# Patient Record
Sex: Female | Born: 1965 | Race: White | Hispanic: No | Marital: Married | Smoking: Former smoker
Health system: Southern US, Community
[De-identification: ages and names within clinical notes are randomized; demographics above are authoritative.]

## PROBLEM LIST (undated history)

## (undated) DIAGNOSIS — D250 Submucous leiomyoma of uterus: Secondary | ICD-10-CM

## (undated) DIAGNOSIS — R350 Frequency of micturition: Secondary | ICD-10-CM

## (undated) DIAGNOSIS — N950 Postmenopausal bleeding: Secondary | ICD-10-CM

## (undated) DIAGNOSIS — Z973 Presence of spectacles and contact lenses: Secondary | ICD-10-CM

## (undated) DIAGNOSIS — E039 Hypothyroidism, unspecified: Secondary | ICD-10-CM

## (undated) DIAGNOSIS — Z862 Personal history of diseases of the blood and blood-forming organs and certain disorders involving the immune mechanism: Secondary | ICD-10-CM

## (undated) DIAGNOSIS — F909 Attention-deficit hyperactivity disorder, unspecified type: Secondary | ICD-10-CM

---

## 2015-02-22 HISTORY — PX: BREAST BIOPSY: SHX20

## 2018-05-13 ENCOUNTER — Other Ambulatory Visit: Payer: Self-pay | Admitting: Internal Medicine

## 2018-05-13 DIAGNOSIS — E041 Nontoxic single thyroid nodule: Secondary | ICD-10-CM

## 2018-05-13 DIAGNOSIS — Z1231 Encounter for screening mammogram for malignant neoplasm of breast: Secondary | ICD-10-CM

## 2018-05-18 ENCOUNTER — Ambulatory Visit: Payer: Self-pay

## 2018-05-18 ENCOUNTER — Ambulatory Visit: Payer: Commercial Managed Care - PPO

## 2018-06-11 ENCOUNTER — Ambulatory Visit
Admission: RE | Admit: 2018-06-11 | Discharge: 2018-06-11 | Disposition: A | Discharge status: Home / Self Care | Payer: Commercial Managed Care - PPO | Source: Ambulatory Visit | Attending: Internal Medicine | Admitting: Internal Medicine

## 2018-06-11 ENCOUNTER — Encounter: Payer: Self-pay | Admitting: Radiology

## 2018-06-11 DIAGNOSIS — Z1231 Encounter for screening mammogram for malignant neoplasm of breast: Secondary | ICD-10-CM

## 2018-07-02 ENCOUNTER — Ambulatory Visit: Payer: Commercial Managed Care - PPO

## 2018-07-05 ENCOUNTER — Other Ambulatory Visit: Payer: Self-pay | Admitting: Internal Medicine

## 2018-07-05 DIAGNOSIS — N6310 Unspecified lump in the right breast, unspecified quadrant: Secondary | ICD-10-CM

## 2018-07-05 DIAGNOSIS — R928 Other abnormal and inconclusive findings on diagnostic imaging of breast: Secondary | ICD-10-CM

## 2018-07-05 DIAGNOSIS — N6320 Unspecified lump in the left breast, unspecified quadrant: Secondary | ICD-10-CM

## 2018-07-07 ENCOUNTER — Ambulatory Visit
Admission: RE | Admit: 2018-07-07 | Discharge: 2018-07-07 | Disposition: A | Discharge status: Home / Self Care | Payer: Commercial Managed Care - PPO | Source: Ambulatory Visit | Attending: Internal Medicine | Admitting: Internal Medicine

## 2018-07-07 DIAGNOSIS — N6310 Unspecified lump in the right breast, unspecified quadrant: Secondary | ICD-10-CM | POA: Diagnosis present

## 2018-07-07 DIAGNOSIS — R928 Other abnormal and inconclusive findings on diagnostic imaging of breast: Secondary | ICD-10-CM | POA: Insufficient documentation

## 2018-07-07 DIAGNOSIS — N6320 Unspecified lump in the left breast, unspecified quadrant: Secondary | ICD-10-CM

## 2018-07-28 ENCOUNTER — Ambulatory Visit: Payer: Commercial Managed Care - PPO | Admitting: Obstetrics & Gynecology

## 2018-07-28 ENCOUNTER — Encounter: Payer: Self-pay | Admitting: Obstetrics & Gynecology

## 2018-07-28 VITALS — BP 120/82 | Ht 60.0 in | Wt 150.0 lb

## 2018-07-28 DIAGNOSIS — N921 Excessive and frequent menstruation with irregular cycle: Secondary | ICD-10-CM

## 2018-07-28 DIAGNOSIS — D219 Benign neoplasm of connective and other soft tissue, unspecified: Secondary | ICD-10-CM | POA: Diagnosis not present

## 2018-07-28 MED ORDER — PROGESTERONE MICRONIZED 100 MG PO CAPS: 100.0000 mg | ORAL_CAPSULE | Freq: Every day | ORAL | 4 refills | Status: DC

## 2018-07-28 NOTE — Progress Notes (Signed)
    Samantha Schultz [birth year] [id number]        53 y.o. G0 Married  RP:  Menometrorrhagia with vaginal bleeding x 06/24/2018  HPI:  Seen by me at [place] 02/2016.  [place] 36 at that time.  Since then though, has had menses every 2-3 months with rather heavy flow.  Bleeding mildly vaginally since 06/24/2018.  No pelvic pain.  No pain with IC.  No hot flushes/night sweats. H/O uterine Fibroids.   OB History  Gravida Para Term Preterm AB Living  0 0 0 0 0 0  SAB TAB Ectopic Multiple Live Births  0 0 0 0 0    Past medical history,surgical history, problem list, medications, allergies, family history and social history were all reviewed and documented in the EPIC chart.   Directed ROS with pertinent positives and negatives documented in the history of present illness/assessment and plan.  Exam:  Vitals:   07/28/18 1509  BP: 120/82  Weight: 150 lb (68 kg)  Height: 5' (1.524 m)   General appearance:  Normal  Abdomen: Normal  Gynecologic exam: Vulva normal.  Bimanual exam:  Uterus AV, mildly increase in size, mobile, NT.  No adnexal mass, NT.   Assessment/Plan:  53 y.o. G0P0000   1. Menometrorrhagia Oligomenorrhea and menometrorrhagia probably in perimenopause.  History of fibroids with a mildly increased size of the uterus on exam today.  Will rule out anemia with a CBC, verify thyroid function and prolactin.  Will check menopausal status with an Norco.  Decision to start on Prometrium 100 mg daily now and will reevaluate per lab results and pelvic ultrasound results at follow-up.  Usage, risks and benefits of Prometrium reviewed with patient and prescription sent to pharmacy.  Pelvic ultrasound to rule out intra-uterine lesion such as polyp, fibroid, endometrial hyperplasia and endometrial cancer.  Will also evaluate for uterine fibroids, location and size. - US Transvaginal Non-OB; Future - CBC - TSH - Prolactin - FSH  2. Fibroids As above, follow-up pelvic ultrasound to  evaluate location and size of fibroids, as well as to rule out endometrial pathology. - US Transvaginal Non-OB; Future  Other orders - escitalopram (LEXAPRO) 20 MG tablet; Take 20 mg by mouth daily. - amphetamine-dextroamphetamine (ADDERALL) 30 MG tablet; Take 30 mg by mouth 2 (two) times daily. - progesterone (PROMETRIUM) 100 MG capsule; Take 1 capsule (100 mg total) by mouth daily.  Counseling on above issues and coordination of care more than 50% for 40 minutes.  Princess Bruins MD, 3:23 PM 07/28/2018

## 2018-07-28 NOTE — Patient Instructions (Signed)
1. Menometrorrhagia Oligomenorrhea and menometrorrhagia probably in perimenopause.  History of fibroids with a mildly increased size of the uterus on exam today.  Will rule out anemia with a CBC, verify thyroid function and prolactin.  Will check menopausal status with an [employer].  Decision to start on Prometrium 100 mg daily now and will reevaluate per lab results and pelvic ultrasound results at follow-up.  Usage, risks and benefits of Prometrium reviewed with patient and prescription sent to pharmacy.  Pelvic ultrasound to rule out intra-uterine lesion such as polyp, fibroid, endometrial hyperplasia and endometrial cancer.  Will also evaluate for uterine fibroids, location and size. - US Transvaginal Non-OB; Future - CBC - TSH - Prolactin - FSH  2. Fibroids As above, follow-up pelvic ultrasound to evaluate location and size of fibroids, as well as to rule out endometrial pathology. - US Transvaginal Non-OB; Future  Other orders - escitalopram (LEXAPRO) 20 MG tablet; Take 20 mg by mouth daily. - amphetamine-dextroamphetamine (ADDERALL) 30 MG tablet; Take 30 mg by mouth 2 (two) times daily. - progesterone (PROMETRIUM) 100 MG capsule; Take 1 capsule (100 mg total) by mouth daily.  Samantha Schultz, it was a pleasure seeing you today!  I will inform you of your results as soon as they are available.

## 2018-07-29 LAB — CBC
HCT: 38 % (ref 35.0–45.0)
Hemoglobin: 12.9 g/dL (ref 11.7–15.5)
MCH: 31.3 pg (ref 27.0–33.0)
MCHC: 33.9 g/dL (ref 32.0–36.0)
MCV: 92.2 fL (ref 80.0–100.0)
MPV: 10.5 fL (ref 7.5–12.5)
Platelets: 360 10*3/uL (ref 140–400)
RBC: 4.12 10*6/uL (ref 3.80–5.10)
RDW: 11.7 % (ref 11.0–15.0)
WBC: 8.9 10*3/uL (ref 3.8–10.8)

## 2018-07-29 LAB — TSH: TSH: 2.83 mIU/L

## 2018-07-29 LAB — FOLLICLE STIMULATING HORMONE: FSH: 80.6 m[IU]/mL

## 2018-07-29 LAB — PROLACTIN: Prolactin: 5.9 ng/mL

## 2018-08-09 ENCOUNTER — Telehealth: Payer: Self-pay | Admitting: *Deleted

## 2018-08-09 NOTE — Telephone Encounter (Signed)
Patient called c/o spotting had visit on 07/28/18 told to schedule ultrasound, scheduled for this on 09/01/18. She is concerned about the spotting, was prescribed Prometrium 100 mg states to help with bleeding? Wanted to know if she could go to outpatient center to have ultrasound done sooner?

## 2018-08-11 MED ORDER — PROGESTERONE MICRONIZED 200 MG PO CAPS: 200.0000 mg | ORAL_CAPSULE | Freq: Every day | ORAL | 0 refills | Status: DC

## 2018-08-11 NOTE — Telephone Encounter (Signed)
Pelvic US 09/01/18 is coming very soon.  Can increase Prometrium to 200 mg HS until then, which may help the spotting.

## 2018-08-11 NOTE — Telephone Encounter (Signed)
Left detailed message on cell per DPR access. 

## 2018-08-11 NOTE — Telephone Encounter (Signed)
Patient called back requesting 200 mg dose sent to pharmacy

## 2018-08-18 ENCOUNTER — Encounter: Payer: Self-pay | Admitting: Obstetrics & Gynecology

## 2018-08-18 ENCOUNTER — Ambulatory Visit: Payer: Commercial Managed Care - PPO | Admitting: Obstetrics & Gynecology

## 2018-08-18 ENCOUNTER — Ambulatory Visit (INDEPENDENT_AMBULATORY_CARE_PROVIDER_SITE_OTHER): Payer: Commercial Managed Care - PPO

## 2018-08-18 VITALS — BP 108/82 | Wt 141.0 lb

## 2018-08-18 DIAGNOSIS — D219 Benign neoplasm of connective and other soft tissue, unspecified: Secondary | ICD-10-CM

## 2018-08-18 DIAGNOSIS — N921 Excessive and frequent menstruation with irregular cycle: Secondary | ICD-10-CM

## 2018-08-18 DIAGNOSIS — N950 Postmenopausal bleeding: Secondary | ICD-10-CM | POA: Diagnosis not present

## 2018-08-18 DIAGNOSIS — R350 Frequency of micturition: Secondary | ICD-10-CM | POA: Diagnosis not present

## 2018-08-18 NOTE — Progress Notes (Signed)
    Samantha Schultz [id number] [birth year] [id number]        53 y.o.  G0P0000 Married  RP: PMB/Fibroids for Pelvic US  HPI: PMB resolved.  Feels some suprapubic pressure and c/O urinary frequency.  No blood in urine.  No flank pain.  No fever.   OB History  Gravida Para Term Preterm AB Living  0 0 0 0 0 0  SAB TAB Ectopic Multiple Live Births  0 0 0 0 0    Past medical history,surgical history, problem list, medications, allergies, family history and social history were all reviewed and documented in the EPIC chart.   Directed ROS with pertinent positives and negatives documented in the history of present illness/assessment and plan.  Exam:  Vitals:   08/18/18 1111  BP: 108/82  Weight: 141 lb (64 kg)   General appearance:  Normal  Pelvic US today: T/V and T/a images.  Anteverted uterus measuring 9.38 x 7.38 x 5.82 cm.  Endometrial lining measured at 5.5 mm.  Intramural fibroids measuring 4.2 x 3.4 x 4.0 cm.  Fibroid probably partially submucosal posteriorly measuring 3.7 x 2.6 cm.  2 smaller fibroids on the right measuring 2.4 x 2.6 cm and 2.0 x 1.9 cm.  Cervix with nabothian cysts.  Right and left ovaries normal.  No apparent mass in the right or left adnexa.  No free fluid in the posterior cul-de-sac.  U/A completely negative   Assessment/Plan:  53 y.o. G0P0000   1. Postmenopausal bleeding Postmenopausal bleeding at 24, not on hormone replacement therapy.  Today's pelvic ultrasound findings reviewed with patient.  The pelvic ultrasound showis an endometrial lining at 5.5 mm.  1 of the intramural fibroids might have a submucosal component posteriorly, the fibroid measures 3.7 x 2.6 cm.  Patient had 4 uterine fibroids measured, the largest of which was 4.2 cm.  Both ovaries are normal and there was no free fluid in the posterior cul-de-sac.  Given the possible submucosal fibroid and the endometrial lining at 5.5 mm, recommendation made to proceed with further investigation with a  sonohysterogram with possible endometrial biopsy.  Procedure described to patient who will schedule the sonohysterogram today. - [place] Sonohysterogram; Future  2. Urinary frequency Urine analysis completely negative, no indication to proceed with a urine culture.  - Urinalysis,Complete w/RFL Culture  Counseling on above issues and coordination of care more than 50% for 15 minutes.  Princess Bruins MD, 11:35 AM 08/18/2018

## 2018-08-18 NOTE — Patient Instructions (Signed)
1. Postmenopausal bleeding Postmenopausal bleeding at 62, not on hormone replacement therapy.  Today's pelvic ultrasound findings reviewed with patient.  The pelvic ultrasound showis an endometrial lining at 5.5 mm.  1 of the intramural fibroids might have a submucosal component posteriorly, the fibroid measures 3.7 x 2.6 cm.  Patient had 4 uterine fibroids measured, the largest of which was 4.2 cm.  Both ovaries are normal and there was no free fluid in the posterior cul-de-sac.  Given the possible submucosal fibroid and the endometrial lining at 5.5 mm, recommendation made to proceed with further investigation with a sonohysterogram with possible endometrial biopsy.  Procedure described to patient who will schedule the sonohysterogram today. - [place] Sonohysterogram; Future  2. Urinary frequency Urine analysis completely negative, no indication to proceed with a urine culture.  - Urinalysis,Complete w/RFL Culture  Samantha Schultz, it was a pleasure seeing you today!

## 2018-08-19 LAB — URINALYSIS, COMPLETE W/RFL CULTURE
BACTERIA UA: NONE SEEN /HPF
Bilirubin Urine: NEGATIVE
Glucose, UA: NEGATIVE
Hgb urine dipstick: NEGATIVE
Hyaline Cast: NONE SEEN /LPF
Ketones, ur: NEGATIVE
Leukocyte Esterase: NEGATIVE
Nitrites, Initial: NEGATIVE
Protein, ur: NEGATIVE
RBC / HPF: NONE SEEN /HPF (ref 0–2)
Specific Gravity, Urine: 1.01 (ref 1.001–1.03)
WBC, UA: NONE SEEN /HPF (ref 0–5)
pH: 7 (ref 5.0–8.0)

## 2018-08-19 LAB — NO CULTURE INDICATED

## 2018-09-01 ENCOUNTER — Ambulatory Visit: Payer: Commercial Managed Care - PPO | Admitting: Obstetrics & Gynecology

## 2018-09-01 ENCOUNTER — Other Ambulatory Visit: Payer: Commercial Managed Care - PPO

## 2018-09-07 ENCOUNTER — Other Ambulatory Visit: Payer: Self-pay

## 2018-09-07 ENCOUNTER — Ambulatory Visit (INDEPENDENT_AMBULATORY_CARE_PROVIDER_SITE_OTHER): Payer: Commercial Managed Care - PPO

## 2018-09-07 ENCOUNTER — Ambulatory Visit: Payer: Commercial Managed Care - PPO | Admitting: Obstetrics & Gynecology

## 2018-09-07 DIAGNOSIS — R9389 Abnormal findings on diagnostic imaging of other specified body structures: Secondary | ICD-10-CM

## 2018-09-07 DIAGNOSIS — D219 Benign neoplasm of connective and other soft tissue, unspecified: Secondary | ICD-10-CM

## 2018-09-07 DIAGNOSIS — N950 Postmenopausal bleeding: Secondary | ICD-10-CM | POA: Diagnosis not present

## 2018-09-07 NOTE — Progress Notes (Signed)
    Samantha Schultz [birth year] [id number]        53 y.o.  G0  RP: PMB/Probable Fibroid with SM component for Sonohysterogram  HPI: PMB resolved.  No pelvic pain.   OB History  Gravida Para Term Preterm AB Living  0 0 0 0 0 0  SAB TAB Ectopic Multiple Live Births  0 0 0 0 0    Past medical history,surgical history, problem list, medications, allergies, family history and social history were all reviewed and documented in the EPIC chart.   Directed ROS with pertinent positives and negatives documented in the history of present illness/assessment and plan.  Exam:  There were no vitals filed for this visit. General appearance:  Normal                                                                    Sono Infusion Hysterogram ( procedure note)   The initial transvaginal ultrasound demonstrated the following: Uterus with fibroids, including a posterior fibroid encroaching the cavity.  The endometrial lining is measured at 6.2 mm.  The right and left ovaries are normal.  No free fluid in the posterior to the sac.  The speculum  was inserted and the cervix cleansed with Betadine solution after confirming that patient has no allergies.A small sonohysterography catheterwas utilized.  Insertion was facilitated with ring forceps, using a spear-like motion the catheter was inserted to the fundus of the uterus. The speculum is then removed carefully to avoid dislodging the catheter. The catheter was flushed with sterile saline delete prior to insertion to rid it of small amounts of air.the sterile saline solution was infused into the uterine cavity as a vaginal ultrasound probe was then placed in the vagina for full visualization of the uterine cavity from a transvaginal approach. The following was noted: Following injection of saline the endometrial cavity is filled with no defect seen.  The catheter was then removed after retrieving some of the saline from the intrauterine cavity. An  endometrial biopsy was done. Patient tolerated procedure well. She had received a tablet of Aleve for discomfort.    Assessment/Plan:  53 y.o. G0  1. Postmenopausal bleeding Resolved postmenopausal bleeding.  Endometrial lining at 6.2 mm, no intra-uterine defect.  Endometrial biopsy done, results pending.  Will continue on Prometrium daily to control the endometrium.  2. Thickened endometrium EBx done, pending results.  Continue on Prometrium 100 mg HS daily.  3. Fibroid Intramural fibroids.  Counseling on above issues and coordination of care more than 50% for 15 minutes.  Princess Bruins MD, 4:26 PM 09/07/2018

## 2018-09-09 ENCOUNTER — Other Ambulatory Visit: Payer: Self-pay | Admitting: Obstetrics & Gynecology

## 2018-09-13 ENCOUNTER — Encounter: Payer: Self-pay | Admitting: Obstetrics & Gynecology

## 2018-09-13 NOTE — Patient Instructions (Signed)
1. Postmenopausal bleeding Resolved postmenopausal bleeding.  Endometrial lining at 6.2 mm, no intra-uterine defect.  Endometrial biopsy done, results pending.  Will continue on Prometrium daily to control the endometrium.  2. Thickened endometrium EBx done, pending results.  Continue on Prometrium 100 mg HS daily.  3. Fibroid Intramural fibroids.  Azalia, it was a pleasure seeing you today!  I will inform you of your results as soon as they are available.

## 2018-12-09 ENCOUNTER — Encounter: Payer: Commercial Managed Care - PPO | Admitting: Obstetrics & Gynecology

## 2018-12-30 ENCOUNTER — Other Ambulatory Visit: Payer: Self-pay | Admitting: Obstetrics & Gynecology

## 2019-01-12 ENCOUNTER — Encounter: Payer: Self-pay | Admitting: Obstetrics & Gynecology

## 2019-01-12 ENCOUNTER — Ambulatory Visit (INDEPENDENT_AMBULATORY_CARE_PROVIDER_SITE_OTHER): Payer: Commercial Managed Care - PPO | Admitting: Obstetrics & Gynecology

## 2019-01-12 ENCOUNTER — Other Ambulatory Visit: Payer: Self-pay

## 2019-01-12 VITALS — BP 126/78 | Ht 59.0 in | Wt 145.0 lb

## 2019-01-12 DIAGNOSIS — Z01419 Encounter for gynecological examination (general) (routine) without abnormal findings: Secondary | ICD-10-CM

## 2019-01-12 DIAGNOSIS — E663 Overweight: Secondary | ICD-10-CM | POA: Diagnosis not present

## 2019-01-12 DIAGNOSIS — Z780 Asymptomatic menopausal state: Secondary | ICD-10-CM | POA: Diagnosis not present

## 2019-01-12 MED ORDER — PROGESTERONE MICRONIZED 100 MG PO CAPS: 100.0000 mg | ORAL_CAPSULE | Freq: Every day | ORAL | 4 refills | Status: DC

## 2019-01-12 NOTE — Progress Notes (Signed)
Samantha Schultz [id number] 07/26/1965 [id number]   History:    53 y.o. G0  Married  RP:  Established patient presenting for annual gyn exam   HPI: Had perimenopausal bleeding 06/2018 to 07/2018.  Pelvic US/SonoHysto showing Fibroids with a 6.2 mm endometrial lining and a normal endometrial cavity.  EBx benign.  [place] 80.6 in 07/2018.  Started on Prometrium at that time, no vaginal bleeding since then.  No pelvic pain.  No pain with IC.  Urine normal.  Managing tendency towards constipation.  Breasts normal.  BMI 29.29.  Walking regularly.  Health labs with Fam MD.  Past medical history,surgical history, family history and social history were all reviewed and documented in the EPIC chart.  Gynecologic History Patient's last menstrual period was 06/23/2018. Contraception: post menopausal status Last Pap: 2017. Results were: normal per patient Last mammogram: 06/2018. Results were: Benign Bone Density: Never Colonoscopy: Never.  Cologard  Obstetric History OB History  Gravida Para Term Preterm AB Living  0 0 0 0 0 0  SAB TAB Ectopic Multiple Live Births  0 0 0 0 0     ROS: A ROS was performed and pertinent positives and negatives are included in the history.  GENERAL: No fevers or chills. HEENT: No change in vision, no earache, sore throat or sinus congestion. NECK: No pain or stiffness. CARDIOVASCULAR: No chest pain or pressure. No palpitations. PULMONARY: No shortness of breath, cough or wheeze. GASTROINTESTINAL: No abdominal pain, nausea, vomiting or diarrhea, melena or bright red blood per rectum. GENITOURINARY: No urinary frequency, urgency, hesitancy or dysuria. MUSCULOSKELETAL: No joint or muscle pain, no back pain, no recent trauma. DERMATOLOGIC: No rash, no itching, no lesions. ENDOCRINE: No polyuria, polydipsia, no heat or cold intolerance. No recent change in weight. HEMATOLOGICAL: No anemia or easy bruising or bleeding. NEUROLOGIC: No headache, seizures, numbness, tingling or weakness.  PSYCHIATRIC: No depression, no loss of interest in normal activity or change in sleep pattern.     Exam:   BP 126/78   Ht 4\' 11"  (1.499 m)   Wt 145 lb (65.8 kg)   LMP 06/23/2018   BMI 29.29 kg/m   Body mass index is 29.29 kg/m.  General appearance : Well developed well nourished female. No acute distress HEENT: Eyes: no retinal hemorrhage or exudates,  Neck supple, trachea midline, no carotid bruits, no thyroidmegaly Lungs: Clear to auscultation, no rhonchi or wheezes, or rib retractions  Heart: Regular rate and rhythm, no murmurs or gallops Breast:Examined in sitting and supine position were symmetrical in appearance, no palpable masses or tenderness,  no skin retraction, no nipple inversion, no nipple discharge, no skin discoloration, no axillary or supraclavicular lymphadenopathy Abdomen: no palpable masses or tenderness, no rebound or guarding Extremities: no edema or skin discoloration or tenderness  Pelvic: Vulva: Normal             Vagina: No gross lesions or discharge  Cervix: No gross lesions or discharge.  Pap reflex done.  Uterus  AV, normal size, shape and consistency, non-tender and mobile  Adnexa  Without masses or tenderness  Anus: Normal   Assessment/Plan:  54 y.o. female for annual exam   1. Encounter for routine gynecological examination with Papanicolaou smear of cervix Normal gynecologic exam in menopause.  Pap reflex done.  Breast exam normal.  Screening mammogram January 2020 was benign.  Cologuard in 2019.  Recommend doing a colonoscopy in 2021.  Health labs with family physician.  2. Postmenopause Postmenopausal with no further postmenopausal  bleeding since started on Prometrium in March 2020.  Sonohysterogram showed a normal endometrial cavity and endometrial biopsy March 2020 was benign.  Will continue on Prometrium 100 mg at bedtime every night.  Prescription sent to pharmacy.  Recommend vitamin D supplements, calcium intake of 1200 mg daily and  regular weightbearing physical activities.  3. Overweight (BMI 25.0-29.9) Recommend decreasing calories and carbs.  Continue with fitness activities.  Other orders - busPIRone (BUSPAR) 5 MG tablet; Take 5 mg by mouth 3 (three) times daily. - progesterone (PROMETRIUM) 100 MG capsule; Take 1 capsule (100 mg total) by mouth at bedtime.  Princess Bruins MD, 3:26 PM 01/12/2019

## 2019-01-12 NOTE — Patient Instructions (Signed)
1. Encounter for routine gynecological examination with Papanicolaou smear of cervix Normal gynecologic exam in menopause.  Pap reflex done.  Breast exam normal.  Screening mammogram January 2020 was benign.  Cologuard in 2019.  Recommend doing a colonoscopy in 2021.  Health labs with family physician.  2. Postmenopause Postmenopausal with no further postmenopausal bleeding since started on Prometrium in March 2020.  Sonohysterogram showed a normal endometrial cavity and endometrial biopsy March 2020 was benign.  Will continue on Prometrium 100 mg at bedtime every night.  Prescription sent to pharmacy.  Recommend vitamin D supplements, calcium intake of 1200 mg daily and regular weightbearing physical activities.  3. Overweight (BMI 25.0-29.9) Recommend decreasing calories and carbs.  Continue with fitness activities.  Other orders - busPIRone (BUSPAR) 5 MG tablet; Take 5 mg by mouth 3 (three) times daily. - progesterone (PROMETRIUM) 100 MG capsule; Take 1 capsule (100 mg total) by mouth at bedtime.  Samantha Schultz, it was a pleasure seeing you today!  I will inform you of your results as soon as they are available.

## 2019-01-12 NOTE — Addendum Note (Signed)
Addended by: Thurnell Garbe A on: 01/12/2019 03:56 PM   Modules accepted: Orders

## 2019-01-13 LAB — PAP IG W/ RFLX HPV ASCU

## 2019-03-02 NOTE — Telephone Encounter (Signed)
Not unusual to have some irregular bleeding in the perimenopause.  She is on hormone replacement and on her dosage she should not be bleeding at all.  She was evaluated in March to include ultrasound and negative biopsy which makes it highly unlikely that there is significant pathology going on.  At this point I would continue on her medication and leave the message for Dr Dellis Filbert to see if she wants to do anything with this.

## 2019-03-08 ENCOUNTER — Other Ambulatory Visit: Payer: Self-pay

## 2019-03-08 DIAGNOSIS — N950 Postmenopausal bleeding: Secondary | ICD-10-CM

## 2019-03-15 ENCOUNTER — Other Ambulatory Visit: Payer: Self-pay

## 2019-03-15 MED ORDER — MEGESTROL ACETATE 20 MG PO TABS: ORAL_TABLET | ORAL | 0 refills | Status: DC

## 2019-04-02 ENCOUNTER — Other Ambulatory Visit: Payer: Self-pay | Admitting: Obstetrics & Gynecology

## 2019-04-04 ENCOUNTER — Other Ambulatory Visit: Payer: Self-pay

## 2019-04-04 NOTE — Addendum Note (Signed)
Addended by: Ramond Craver on: 04/04/2019 10:08 AM   Modules accepted: Orders

## 2019-04-06 ENCOUNTER — Other Ambulatory Visit: Payer: Self-pay

## 2019-04-07 ENCOUNTER — Ambulatory Visit (INDEPENDENT_AMBULATORY_CARE_PROVIDER_SITE_OTHER): Payer: Commercial Managed Care - PPO

## 2019-04-07 ENCOUNTER — Encounter: Payer: Self-pay | Admitting: Obstetrics & Gynecology

## 2019-04-07 ENCOUNTER — Ambulatory Visit: Payer: Commercial Managed Care - PPO | Admitting: Obstetrics & Gynecology

## 2019-04-07 VITALS — BP 126/80

## 2019-04-07 DIAGNOSIS — D250 Submucous leiomyoma of uterus: Secondary | ICD-10-CM

## 2019-04-07 DIAGNOSIS — N950 Postmenopausal bleeding: Secondary | ICD-10-CM

## 2019-04-07 MED ORDER — PROGESTERONE MICRONIZED 100 MG PO CAPS: 100.0000 mg | ORAL_CAPSULE | Freq: Every day | ORAL | 4 refills | Status: DC

## 2019-04-07 NOTE — Progress Notes (Signed)
    Samantha Schultz [id number] 08-13-1965 [id number]        53 y.o.  G0P0000   RP: PMB for Pelvic US  HPI: Continued postmenopausal bleeding on Prometrium and at times on Megace.  Menopause was confirmed via high [place] in February 2020.  Endometrial biopsy March 2020 was benign.   OB History  Gravida Para Term Preterm AB Living  0 0 0 0 0 0  SAB TAB Ectopic Multiple Live Births  0 0 0 0 0    Past medical history,surgical history, problem list, medications, allergies, family history and social history were all reviewed and documented in the EPIC chart.   Directed ROS with pertinent positives and negatives documented in the history of present illness/assessment and plan.  Exam:  Vitals:   04/07/19 1115  BP: 126/80   General appearance:  Normal  Pelvic US today: T/V images.  Pelvic ultrasound compared to previous scan February 2020.  Anteverted uterus enlarged measuring 10.44 x 8.86 x 7.18 cm.  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  The submucosal fibroid is in the left horn of the cavity, measuring 1.43 x 0.86 cm.  Endometrial lining is measured at 9.18 mm.  Both ovaries are small with sparse subcentimeter follicles.  No adnexal mass.  No free fluid in the posterior cul-de-sac.   Assessment/Plan:  53 y.o. G0P0000   1. Postmenopausal bleeding Submucosal fibroid in the left horn of the uterus measuring 1.43 x 0.86 cm.  The endometrial lining is measured at 9.18 mm.  The pelvic ultrasound reveals also intramural fibroids and probable adenomyosis.  The ovaries are normal.  2. Fibroids, submucosal Given an intrauterine lesion compatible with a submucosal fibroid and a thickened endometrial lining, decision to proceed with hysteroscopy with excision of the lesion and D&C.  Schedule HSC Myosure excision of SM Myoma, D+C.  Surgical procedure explained.  Preop preparation,  surgical risks and benefits, as well as postop precautions reviewed thoroughly with patient.  Patient voiced understanding and agreement with plan.  [place] pamphlet given.  Other orders - megestrol (MEGACE) 20 MG tablet; Take 20 mg by mouth daily. - progesterone (PROMETRIUM) 100 MG capsule; Take 1 capsule (100 mg total) by mouth at bedtime.                        Patient was counseled as to the risk of surgery to include the following:  1. Infection (prohylactic antibiotics will be administered)  2. DVT/Pulmonary Embolism (prophylactic pneumo compression stockings will be used)  3.Trauma to internal organs requiring additional surgical procedure to repair any injury to internal organs requiring perhaps additional hospitalization days.  4.Hemmorhage requiring transfusion and blood products which carry risks such as anaphylactic reaction, hepatitis and AIDS  Patient had received literature information on the procedure scheduled and all her questions were answered and fully accepts all risks.   Princess Bruins MD, 11:47 AM 04/07/2019

## 2019-04-11 ENCOUNTER — Encounter: Payer: Self-pay | Admitting: Anesthesiology

## 2019-04-13 NOTE — Telephone Encounter (Signed)
Her surgery is scheduled Nov 9th. I guess that was typo on her part.

## 2019-04-14 MED ORDER — MEGESTROL ACETATE 40 MG PO TABS: 40.0000 mg | ORAL_TABLET | Freq: Two times a day (BID) | ORAL | 0 refills | Status: DC

## 2019-04-15 ENCOUNTER — Encounter: Payer: Self-pay | Admitting: Obstetrics & Gynecology

## 2019-04-15 NOTE — Patient Instructions (Signed)
1. Postmenopausal bleeding Submucosal fibroid in the left horn of the uterus measuring 1.43 x 0.86 cm.  The endometrial lining is measured at 9.18 mm.  The pelvic ultrasound reveals also intramural fibroids and probable adenomyosis.  The ovaries are normal.  2. Fibroids, submucosal Given an intrauterine lesion compatible with a submucosal fibroid and a thickened endometrial lining, decision to proceed with hysteroscopy with excision of the lesion and D&C.  Schedule HSC Myosure excision of SM Myoma, D+C.  Surgical procedure explained.  Preop preparation, surgical risks and benefits, as well as postop precautions reviewed thoroughly with patient.  Patient voiced understanding and agreement with plan.  [place] pamphlet given.  Other orders - megestrol (MEGACE) 20 MG tablet; Take 20 mg by mouth daily. - progesterone (PROMETRIUM) 100 MG capsule; Take 1 capsule (100 mg total) by mouth at bedtime.                        Patient was counseled as to the risk of surgery to include the following:  1. Infection (prohylactic antibiotics will be administered)  2. DVT/Pulmonary Embolism (prophylactic pneumo compression stockings will be used)  3.Trauma to internal organs requiring additional surgical procedure to repair any injury to internal organs requiring perhaps additional hospitalization days.  4.Hemmorhage requiring transfusion and blood products which carry risks such as anaphylactic reaction, hepatitis and AIDS  Patient had received literature information on the procedure scheduled and all her questions were answered and fully accepts all risks.  Samantha Schultz, it was a pleasure seeing you today!

## 2019-04-22 ENCOUNTER — Other Ambulatory Visit (HOSPITAL_COMMUNITY): Payer: Commercial Managed Care - PPO

## 2019-04-25 ENCOUNTER — Telehealth: Payer: Self-pay

## 2019-04-25 NOTE — Telephone Encounter (Signed)
Patient questioned what type anesthesia she will have with D&C , Hysteroscopy.  She said you told her "she would breathe it in".

## 2019-04-25 NOTE — Telephone Encounter (Signed)
Patient said at her other MD that her WBC count was elevated last time she was there and he suspected possibly had a cold,etc.   Today she calls because she said she has a tooth in the back that is "a little loose" and doesn't hurt a lot but she wondered if that might have been why WBC was elevated.   She questioned if she can have her surgery with this situation.  I spoke with Langley Gauss at [facility]. She said that anesthesia will talk with her about it and explain that they will be as careful as they can around her tooth.  A slight WBC elevation will not prevent her from having her surgery.  Patient advised.

## 2019-04-25 NOTE — Telephone Encounter (Signed)
"  Breathe it in" ?  That is not an expression of mine...  General anesthesia with laryngeal mask is the most common.  Could discuss IV sedation with anesthesia.

## 2019-04-26 NOTE — Telephone Encounter (Signed)
Detailed message left in her voice mail per [facility] access note on file.

## 2019-04-29 ENCOUNTER — Encounter (HOSPITAL_BASED_OUTPATIENT_CLINIC_OR_DEPARTMENT_OTHER): Payer: Self-pay | Admitting: *Deleted

## 2019-04-29 ENCOUNTER — Other Ambulatory Visit: Payer: Self-pay

## 2019-04-29 ENCOUNTER — Other Ambulatory Visit (HOSPITAL_COMMUNITY): Payer: Commercial Managed Care - PPO

## 2019-04-29 ENCOUNTER — Telehealth: Payer: Self-pay

## 2019-04-29 ENCOUNTER — Encounter (HOSPITAL_COMMUNITY)
Admission: RE | Admit: 2019-04-29 | Discharge: 2019-04-29 | Disposition: A | Discharge status: Home / Self Care | Payer: Commercial Managed Care - PPO | Source: Ambulatory Visit | Attending: Obstetrics & Gynecology | Admitting: Obstetrics & Gynecology

## 2019-04-29 ENCOUNTER — Other Ambulatory Visit (HOSPITAL_COMMUNITY)
Admission: RE | Admit: 2019-04-29 | Discharge: 2019-04-29 | Disposition: A | Discharge status: Home / Self Care | Payer: Commercial Managed Care - PPO | Source: Ambulatory Visit | Attending: Obstetrics & Gynecology | Admitting: Obstetrics & Gynecology

## 2019-04-29 ENCOUNTER — Other Ambulatory Visit: Payer: Commercial Managed Care - PPO

## 2019-04-29 DIAGNOSIS — Z01812 Encounter for preprocedural laboratory examination: Secondary | ICD-10-CM | POA: Insufficient documentation

## 2019-04-29 DIAGNOSIS — Z20828 Contact with and (suspected) exposure to other viral communicable diseases: Secondary | ICD-10-CM | POA: Insufficient documentation

## 2019-04-29 LAB — CBC
HCT: 38.6 % (ref 36.0–46.0)
Hemoglobin: 12.3 g/dL (ref 12.0–15.0)
MCH: 30.9 pg (ref 26.0–34.0)
MCHC: 31.9 g/dL (ref 30.0–36.0)
MCV: 97 fL (ref 80.0–100.0)
Platelets: 320 10*3/uL (ref 150–400)
RBC: 3.98 MIL/uL (ref 3.87–5.11)
RDW: 12.6 % (ref 11.5–15.5)
WBC: 7.6 10*3/uL (ref 4.0–10.5)
nRBC: 0 % (ref 0.0–0.2)

## 2019-04-29 LAB — SARS CORONAVIRUS 2 (TAT 6-24 HRS): SARS Coronavirus 2: NEGATIVE

## 2019-04-29 NOTE — Telephone Encounter (Signed)
Patient's surgery is Monday.  [facility] called. Needs you to go in and make correction to you operative permit. It contains abbreviation (D&C) and they need the procedure to be written out.

## 2019-04-29 NOTE — Progress Notes (Signed)
Spoke w/ via phone for pre-op interview--- PT Lab needs dos----  Urine preg             Lab results------ CBC done 04-29-2019 in chart/ epic COVID test ------ 04-29-2019 Arrive at ------- 0530 NPO after ------ MN Medications to take morning of surgery ----- AM meds w/ sips of water.  Diabetic medication ----- n/a Patient Special Instructions ----- n/a Pre-Op special Istructions ----- n/a Patient verbalized understanding of instructions that were given at this phone interview. Patient denies shortness of breath, chest pain, fever, cough a this phone interview.

## 2019-04-29 NOTE — Progress Notes (Signed)
Call to surgery scheduler, Juliann Pulse, left voice mail to have abbreviations on consent order corrected.

## 2019-05-01 NOTE — Anesthesia Preprocedure Evaluation (Addendum)
Anesthesia Evaluation  Patient identified by MRN, date of birth, ID band Patient awake    Reviewed: Allergy & Precautions, NPO status , Patient's Chart, lab work & pertinent test results  History of Anesthesia Complications Negative for: history of anesthetic complications  Airway Mallampati: II  TM Distance: >3 FB Neck ROM: Full    Dental  (+) Loose,    Pulmonary Patient abstained from smoking., former smoker,    Pulmonary exam normal        Cardiovascular negative cardio ROS Normal cardiovascular exam     Neuro/Psych ADHDnegative neurological ROS     GI/Hepatic negative GI ROS, Neg liver ROS,   Endo/Other  Hypothyroidism   Renal/GU negative Renal ROS  negative genitourinary   Musculoskeletal negative musculoskeletal ROS (+)   Abdominal   Peds  Hematology negative hematology ROS (+)   Anesthesia Other Findings Day of surgery medications reviewed with patient.  Reproductive/Obstetrics negative OB ROS                            Anesthesia Physical Anesthesia Plan  ASA: II  Anesthesia Plan: General   Post-op Pain Management:    Induction: Intravenous  PONV Risk Score and Plan: 3 and Treatment may vary due to age or medical condition, Ondansetron, Dexamethasone and Midazolam  Airway Management Planned: LMA  Additional Equipment: None  Intra-op Plan:   Post-operative Plan: Extubation in OR  Informed Consent: I have reviewed the patients History and Physical, chart, labs and discussed the procedure including the risks, benefits and alternatives for the proposed anesthesia with the patient or authorized representative who has indicated his/her understanding and acceptance.     Dental advisory given  Plan Discussed with: CRNA  Anesthesia Plan Comments:        Anesthesia Quick Evaluation

## 2019-05-02 ENCOUNTER — Ambulatory Visit (HOSPITAL_BASED_OUTPATIENT_CLINIC_OR_DEPARTMENT_OTHER): Payer: Commercial Managed Care - PPO | Admitting: Anesthesiology

## 2019-05-02 ENCOUNTER — Other Ambulatory Visit: Payer: Self-pay

## 2019-05-02 ENCOUNTER — Encounter (HOSPITAL_BASED_OUTPATIENT_CLINIC_OR_DEPARTMENT_OTHER): Payer: Self-pay | Admitting: *Deleted

## 2019-05-02 ENCOUNTER — Encounter (HOSPITAL_BASED_OUTPATIENT_CLINIC_OR_DEPARTMENT_OTHER)
Admission: RE | Disposition: A | Discharge status: Home / Self Care | Payer: Self-pay | Source: Home / Self Care | Attending: Obstetrics & Gynecology

## 2019-05-02 ENCOUNTER — Ambulatory Visit (HOSPITAL_BASED_OUTPATIENT_CLINIC_OR_DEPARTMENT_OTHER)
Admission: RE | Admit: 2019-05-02 | Discharge: 2019-05-02 | Disposition: A | Discharge status: Home / Self Care | Payer: Commercial Managed Care - PPO | Attending: Obstetrics & Gynecology | Admitting: Obstetrics & Gynecology

## 2019-05-02 DIAGNOSIS — Z87891 Personal history of nicotine dependence: Secondary | ICD-10-CM | POA: Insufficient documentation

## 2019-05-02 DIAGNOSIS — D251 Intramural leiomyoma of uterus: Secondary | ICD-10-CM | POA: Diagnosis not present

## 2019-05-02 DIAGNOSIS — Q51810 Arcuate uterus: Secondary | ICD-10-CM | POA: Insufficient documentation

## 2019-05-02 DIAGNOSIS — Z803 Family history of malignant neoplasm of breast: Secondary | ICD-10-CM | POA: Insufficient documentation

## 2019-05-02 DIAGNOSIS — N840 Polyp of corpus uteri: Secondary | ICD-10-CM | POA: Diagnosis not present

## 2019-05-02 DIAGNOSIS — Z79899 Other long term (current) drug therapy: Secondary | ICD-10-CM | POA: Diagnosis not present

## 2019-05-02 DIAGNOSIS — D250 Submucous leiomyoma of uterus: Secondary | ICD-10-CM | POA: Insufficient documentation

## 2019-05-02 DIAGNOSIS — Z833 Family history of diabetes mellitus: Secondary | ICD-10-CM | POA: Insufficient documentation

## 2019-05-02 DIAGNOSIS — Z885 Allergy status to narcotic agent status: Secondary | ICD-10-CM | POA: Insufficient documentation

## 2019-05-02 DIAGNOSIS — N950 Postmenopausal bleeding: Secondary | ICD-10-CM | POA: Insufficient documentation

## 2019-05-02 DIAGNOSIS — Z91040 Latex allergy status: Secondary | ICD-10-CM | POA: Insufficient documentation

## 2019-05-02 DIAGNOSIS — E039 Hypothyroidism, unspecified: Secondary | ICD-10-CM | POA: Diagnosis not present

## 2019-05-02 DIAGNOSIS — F909 Attention-deficit hyperactivity disorder, unspecified type: Secondary | ICD-10-CM | POA: Diagnosis not present

## 2019-05-02 DIAGNOSIS — Z881 Allergy status to other antibiotic agents status: Secondary | ICD-10-CM | POA: Insufficient documentation

## 2019-05-02 HISTORY — DX: Attention-deficit hyperactivity disorder, unspecified type: F90.9

## 2019-05-02 HISTORY — DX: Postmenopausal bleeding: N95.0

## 2019-05-02 HISTORY — PX: DILATATION & CURETTAGE/HYSTEROSCOPY WITH MYOSURE: SHX6511

## 2019-05-02 HISTORY — DX: Submucous leiomyoma of uterus: D25.0

## 2019-05-02 HISTORY — DX: Hypothyroidism, unspecified: E03.9

## 2019-05-02 HISTORY — DX: Frequency of micturition: R35.0

## 2019-05-02 HISTORY — DX: Personal history of diseases of the blood and blood-forming organs and certain disorders involving the immune mechanism: Z86.2

## 2019-05-02 HISTORY — DX: Presence of spectacles and contact lenses: Z97.3

## 2019-05-02 LAB — POCT I-STAT, CHEM 8
BUN: 14 mg/dL (ref 6–20)
Calcium, Ion: 1.25 mmol/L (ref 1.15–1.40)
Chloride: 105 mmol/L (ref 98–111)
Creatinine, Ser: 0.8 mg/dL (ref 0.44–1.00)
Glucose, Bld: 88 mg/dL (ref 70–99)
HCT: 34 % — ABNORMAL LOW (ref 36.0–46.0)
Hemoglobin: 11.6 g/dL — ABNORMAL LOW (ref 12.0–15.0)
Potassium: 3.9 mmol/L (ref 3.5–5.1)
Sodium: 140 mmol/L (ref 135–145)
TCO2: 23 mmol/L (ref 22–32)

## 2019-05-02 LAB — POCT PREGNANCY, URINE: Preg Test, Ur: NEGATIVE

## 2019-05-02 SURGERY — DILATATION & CURETTAGE/HYSTEROSCOPY WITH MYOSURE
Anesthesia: General | Site: Vagina

## 2019-05-02 MED ORDER — ONDANSETRON HCL 4 MG/2ML IJ SOLN
INTRAMUSCULAR | Status: AC
  Filled 2019-05-02: qty 2

## 2019-05-02 MED ORDER — ACETAMINOPHEN 500 MG PO TABS
ORAL_TABLET | ORAL | Status: AC
  Filled 2019-05-02: qty 2

## 2019-05-02 MED ORDER — LIDOCAINE 2% (20 MG/ML) 5 ML SYRINGE
INTRAMUSCULAR | Status: DC | PRN
  Administered 2019-05-02: 80 mg via INTRAVENOUS

## 2019-05-02 MED ORDER — FENTANYL CITRATE (PF) 100 MCG/2ML IJ SOLN
25.0000 ug | INTRAMUSCULAR | Status: DC | PRN
  Filled 2019-05-02: qty 1

## 2019-05-02 MED ORDER — PROMETHAZINE HCL 25 MG/ML IJ SOLN
6.2500 mg | INTRAMUSCULAR | Status: DC | PRN
  Filled 2019-05-02: qty 1

## 2019-05-02 MED ORDER — DEXAMETHASONE SODIUM PHOSPHATE 10 MG/ML IJ SOLN
INTRAMUSCULAR | Status: AC
  Filled 2019-05-02: qty 1

## 2019-05-02 MED ORDER — MIDAZOLAM HCL 5 MG/5ML IJ SOLN
INTRAMUSCULAR | Status: DC | PRN
  Administered 2019-05-02: 2 mg via INTRAVENOUS

## 2019-05-02 MED ORDER — SODIUM CHLORIDE 0.9 % IR SOLN
Status: DC | PRN
  Administered 2019-05-02: 3000 mL

## 2019-05-02 MED ORDER — KETOROLAC TROMETHAMINE 30 MG/ML IJ SOLN
30.0000 mg | Freq: Once | INTRAMUSCULAR | Status: DC | PRN
  Filled 2019-05-02: qty 1

## 2019-05-02 MED ORDER — PROPOFOL 10 MG/ML IV BOLUS
INTRAVENOUS | Status: DC | PRN
  Administered 2019-05-02: 150 mg via INTRAVENOUS

## 2019-05-02 MED ORDER — MIDAZOLAM HCL 2 MG/2ML IJ SOLN
INTRAMUSCULAR | Status: AC
  Filled 2019-05-02: qty 2

## 2019-05-02 MED ORDER — FENTANYL CITRATE (PF) 100 MCG/2ML IJ SOLN
INTRAMUSCULAR | Status: AC
  Filled 2019-05-02: qty 2

## 2019-05-02 MED ORDER — CLINDAMYCIN PHOSPHATE 900 MG/50ML IV SOLN
900.0000 mg | INTRAVENOUS | Status: AC
  Administered 2019-05-02: 900 mg via INTRAVENOUS
  Filled 2019-05-02: qty 50

## 2019-05-02 MED ORDER — KETOROLAC TROMETHAMINE 30 MG/ML IJ SOLN
INTRAMUSCULAR | Status: AC
  Filled 2019-05-02: qty 1

## 2019-05-02 MED ORDER — KETOROLAC TROMETHAMINE 30 MG/ML IJ SOLN
INTRAMUSCULAR | Status: DC | PRN
  Administered 2019-05-02: 30 mg via INTRAVENOUS

## 2019-05-02 MED ORDER — DEXAMETHASONE SODIUM PHOSPHATE 10 MG/ML IJ SOLN
INTRAMUSCULAR | Status: DC | PRN
  Administered 2019-05-02: 10 mg via INTRAVENOUS

## 2019-05-02 MED ORDER — LACTATED RINGERS IV SOLN
INTRAVENOUS | Status: DC
  Administered 2019-05-02: 07:00:00 via INTRAVENOUS
  Filled 2019-05-02: qty 1000

## 2019-05-02 MED ORDER — PROPOFOL 10 MG/ML IV BOLUS
INTRAVENOUS | Status: AC
  Filled 2019-05-02: qty 40

## 2019-05-02 MED ORDER — FENTANYL CITRATE (PF) 250 MCG/5ML IJ SOLN
INTRAMUSCULAR | Status: AC
  Filled 2019-05-02: qty 5

## 2019-05-02 MED ORDER — LIDOCAINE HCL 1 % IJ SOLN
INTRAMUSCULAR | Status: DC | PRN
  Administered 2019-05-02: 20 mL

## 2019-05-02 MED ORDER — FENTANYL CITRATE (PF) 100 MCG/2ML IJ SOLN
INTRAMUSCULAR | Status: DC | PRN
  Administered 2019-05-02 (×2): 50 ug via INTRAVENOUS

## 2019-05-02 MED ORDER — TRAMADOL HCL ER 100 MG PO TB24: 100.0000 mg | ORAL_TABLET | Freq: Every day | ORAL | 0 refills | Status: AC

## 2019-05-02 MED ORDER — OXYCODONE HCL 5 MG/5ML PO SOLN
5.0000 mg | Freq: Once | ORAL | Status: DC | PRN
  Filled 2019-05-02: qty 5

## 2019-05-02 MED ORDER — CLINDAMYCIN PHOSPHATE 900 MG/50ML IV SOLN
INTRAVENOUS | Status: AC
  Filled 2019-05-02: qty 50

## 2019-05-02 MED ORDER — LIDOCAINE 2% (20 MG/ML) 5 ML SYRINGE
INTRAMUSCULAR | Status: AC
  Filled 2019-05-02: qty 5

## 2019-05-02 MED ORDER — LACTATED RINGERS IV SOLN
INTRAVENOUS | Status: DC
  Filled 2019-05-02: qty 1000

## 2019-05-02 MED ORDER — ACETAMINOPHEN 500 MG PO TABS
1000.0000 mg | ORAL_TABLET | Freq: Once | ORAL | Status: AC
  Administered 2019-05-02: 1000 mg via ORAL
  Filled 2019-05-02: qty 2

## 2019-05-02 MED ORDER — OXYCODONE HCL 5 MG PO TABS
5.0000 mg | ORAL_TABLET | Freq: Once | ORAL | Status: DC | PRN
  Filled 2019-05-02: qty 1

## 2019-05-02 MED ORDER — ONDANSETRON HCL 4 MG/2ML IJ SOLN
INTRAMUSCULAR | Status: DC | PRN
  Administered 2019-05-02: 4 mg via INTRAVENOUS

## 2019-05-02 MED ORDER — GENTAMICIN SULFATE 40 MG/ML IJ SOLN
320.0000 mg | INTRAVENOUS | Status: AC
  Administered 2019-05-02: 320 mg via INTRAVENOUS
  Filled 2019-05-02: qty 8

## 2019-05-02 SURGICAL SUPPLY — 25 items
CANISTER SUCT 3000ML PPV (MISCELLANEOUS) ×3 IMPLANT
CATH ROBINSON RED A/P 16FR (CATHETERS) IMPLANT
CATH SILICONE 16FRX5CC (CATHETERS) ×3 IMPLANT
COVER WAND RF STERILE (DRAPES) ×3 IMPLANT
DEVICE MYOSURE LITE (MISCELLANEOUS) IMPLANT
DEVICE MYOSURE REACH (MISCELLANEOUS) ×3 IMPLANT
DILATOR CANAL MILEX (MISCELLANEOUS) IMPLANT
ELECT REM PT RETURN 9FT ADLT (ELECTROSURGICAL)
ELECTRODE REM PT RTRN 9FT ADLT (ELECTROSURGICAL) IMPLANT
GAUZE 4X4 16PLY RFD (DISPOSABLE) ×3 IMPLANT
GLOVE BIO SURGEON STRL SZ 6.5 (GLOVE) ×2 IMPLANT
GLOVE BIO SURGEONS STRL SZ 6.5 (GLOVE) ×1
GLOVE BIOGEL PI IND STRL 7.0 (GLOVE) ×2 IMPLANT
GLOVE BIOGEL PI INDICATOR 7.0 (GLOVE) ×4
GOWN STRL REUS W/TWL LRG LVL3 (GOWN DISPOSABLE) ×6 IMPLANT
IV NS IRRIG 3000ML ARTHROMATIC (IV SOLUTION) ×6 IMPLANT
KIT PROCEDURE FLUENT (KITS) ×3 IMPLANT
MYOSURE XL FIBROID (MISCELLANEOUS)
PACK VAGINAL MINOR WOMEN LF (CUSTOM PROCEDURE TRAY) ×3 IMPLANT
PAD OB MATERNITY 4.3X12.25 (PERSONAL CARE ITEMS) ×3 IMPLANT
PAD PREP 24X48 CUFFED NSTRL (MISCELLANEOUS) ×3 IMPLANT
SEAL CERVICAL OMNI LOK (ABLATOR) IMPLANT
SEAL ROD LENS SCOPE MYOSURE (ABLATOR) ×3 IMPLANT
SYSTEM TISS REMOVAL MYOSURE XL (MISCELLANEOUS) IMPLANT
TOWEL OR 17X26 10 PK STRL BLUE (TOWEL DISPOSABLE) ×6 IMPLANT

## 2019-05-02 NOTE — H&P (Signed)
Samantha Schultz is an 53 y.o. female. G0P0000   RP: PMB with a SM Myoma for [place] Myosure Excision/D+C  HPI: Continued postmenopausal bleeding on Prometrium and at times on Megace. Menopause was confirmed via high [place] in February 2020.  Endometrial biopsy March 2020 was benign.  Pertinent Gynecological History: Contraception: post menopausal status Blood transfusions: none Sexually transmitted diseases: no past history Last mammogram: normal  Last pap: normal  OB History: G0   Menstrual History: PMB 07/12/18    Past Medical History:  Diagnosis Date  . ADHD (attention deficit hyperactivity disorder)   . Frequency of micturition   . History of anemia   . Hypothyroidism    followed by pcp  . PMB (postmenopausal bleeding)   . Submucous myoma of uterus   . Wears glasses     Past Surgical History:  Procedure Laterality Date  . BREAST BIOPSY Left 02/2015   benign  . NO PAST SURGERIES      Family History  Problem Relation Age of Onset  . Breast cancer Mother        breast   . Diabetes Mother     Social History:  reports that she quit smoking about 15 months ago. Her smoking use included cigarettes. She quit after 3.00 years of use. She has never used smokeless tobacco. She reports previous alcohol use. She reports that she does not use drugs.  Allergies:  Allergies  Allergen Reactions  . Keflex [Cephalexin]     Pt does not remember reaction  . Codeine     Pt does not remember reaction  . Latex Itching    Medications Prior to Admission  Medication Sig Dispense Refill Last Dose  . amphetamine-dextroamphetamine (ADDERALL) 30 MG tablet Take 30 mg by mouth 2 (two) times daily. Morning and afternoon   05/02/2019 at 0430  . Artificial Tear Ointment (DRY EYES OP) Apply to eye as needed.   Past Week at Unknown time  . BIOTIN PO Take by mouth daily.   05/01/2019 at Unknown time  . busPIRone (BUSPAR) 15 MG tablet Take 15 mg by mouth 2 (two) times daily.   05/02/2019 at 0430   . Cholecalciferol (VITAMIN D3 PO) Take by mouth daily.   05/01/2019 at Unknown time  . COLLAGEN PO Take by mouth daily.   05/01/2019 at Unknown time  . escitalopram (LEXAPRO) 20 MG tablet Take 20 mg by mouth at bedtime.    05/01/2019 at Unknown time  . levothyroxine (SYNTHROID) 25 MCG tablet Take 25 mcg by mouth daily before breakfast.   05/02/2019 at 0430  . megestrol (MEGACE) 40 MG tablet Take 1 tablet (40 mg total) by mouth 2 (two) times daily. 60 tablet 0 05/01/2019 at Unknown time  . progesterone (PROMETRIUM) 100 MG capsule Take 1 capsule (100 mg total) by mouth at bedtime. 90 capsule 4   . traMADol (ULTRAM) 50 MG tablet Take by mouth every 6 (six) hours as needed.   Past Week at Unknown time  . VITAMIN E PO Take by mouth daily.   05/01/2019 at Unknown time    REVIEW OF SYSTEMS: A ROS was performed and pertinent positives and negatives are included in the history.  GENERAL: No fevers or chills. HEENT: No change in vision, no earache, sore throat or sinus congestion. NECK: No pain or stiffness. CARDIOVASCULAR: No chest pain or pressure. No palpitations. PULMONARY: No shortness of breath, cough or wheeze. GASTROINTESTINAL: No abdominal pain, nausea, vomiting or diarrhea, melena or bright red blood per  rectum. GENITOURINARY: No urinary frequency, urgency, hesitancy or dysuria. MUSCULOSKELETAL: No joint or muscle pain, no back pain, no recent trauma. DERMATOLOGIC: No rash, no itching, no lesions. ENDOCRINE: No polyuria, polydipsia, no heat or cold intolerance. No recent change in weight. HEMATOLOGICAL: No anemia or easy bruising or bleeding. NEUROLOGIC: No headache, seizures, numbness, tingling or weakness. PSYCHIATRIC: No depression, no loss of interest in normal activity or change in sleep pattern.     Blood pressure 123/71, pulse 69, temperature 98.6 F (37 C), temperature source Oral, resp. rate 16, height 4\' 11"  (1.499 m), weight 68.1 kg, last menstrual period 06/23/2018, SpO2 100 %.  Physical  Exam:  See office notes   Results for orders placed or performed during the hospital encounter of 05/02/19 (from the past 24 hour(s))  Pregnancy, urine POC     Status: None   Collection Time: 05/02/19  5:53 AM  Result Value Ref Range   Preg Test, Ur NEGATIVE NEGATIVE  I-STAT, chem 8     Status: Abnormal   Collection Time: 05/02/19  7:06 AM  Result Value Ref Range   Sodium 140 135 - 145 mmol/L   Potassium 3.9 3.5 - 5.1 mmol/L   Chloride 105 98 - 111 mmol/L   BUN 14 6 - 20 mg/dL   Creatinine, Ser 0.80 0.44 - 1.00 mg/dL   Glucose, Bld 88 70 - 99 mg/dL   Calcium, Ion 1.25 1.15 - 1.40 mmol/L   TCO2 23 22 - 32 mmol/L   Hemoglobin 11.6 (L) 12.0 - 15.0 g/dL   HCT 34.0 (L) 36.0 - 46.0 %   Pelvic US 04/07/2019: T/V images.  Pelvic ultrasound compared to previous scan February 2020.  Anteverted uterus enlarged measuring 10.44 x 8.86 x 7.18 cm.  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  The submucosal fibroid is in the left horn of the cavity, measuring 1.43 x 0.86 cm.  Endometrial lining is measured at 9.18 mm.  Both ovaries are small with sparse subcentimeter follicles.  No adnexal mass.  No free fluid in the posterior cul-de-sac.   Assessment/Plan:  53 y.o. G0P0000   1. Postmenopausal bleeding Submucosal fibroid in the left horn of the uterus measuring 1.43 x 0.86 cm.  The endometrial lining is measured at 9.18 mm.  The pelvic ultrasound reveals also intramural fibroids and probable adenomyosis.  The ovaries are normal.  2. Fibroids, submucosal Given an intrauterine lesion compatible with a submucosal fibroid and a thickened endometrial lining, decision to proceed with hysteroscopy with excision of the lesion and D&C.  Schedule HSC Myosure excision of SM Myoma, D+C.  Surgical procedure explained.  Preop preparation, surgical risks and benefits, as well as postop  precautions reviewed thoroughly with patient.  Patient voiced understanding and agreement with plan.  [place] pamphlet given.  Other orders - megestrol (MEGACE) 20 MG tablet; Take 20 mg by mouth daily. - progesterone (PROMETRIUM) 100 MG capsule; Take 1 capsule (100 mg total) by mouth at bedtime.                        Patient was counseled as to the risk of surgery to include the following:  1. Infection (prohylactic antibiotics will be administered)  2. DVT/Pulmonary Embolism (prophylactic pneumo compression stockings will be used)  3.Trauma to internal organs requiring additional surgical procedure to repair any injury to internal organs requiring perhaps additional hospitalization days.  4.Hemmorhage requiring transfusion and blood products which carry risks such as anaphylactic reaction, hepatitis and AIDS  Patient had received literature information on the procedure scheduled and all her questions were answered and fully accepts all risks.    Marie-Lyne Lorali Khamis 05/02/2019, 7:15 AM

## 2019-05-02 NOTE — Discharge Instructions (Addendum)
Hysteroscopy, Care After °This sheet gives you information about how to care for yourself after your procedure. Your health care provider may also give you more specific instructions. If you have problems or questions, contact your health care provider. °What can I expect after the procedure? °After the procedure, it is common to have: °· Cramping. °· Bleeding. This can vary from light spotting to menstrual-like bleeding. °Follow these instructions at home: °Activity °· Rest for 1-2 days after the procedure. °· Do not douche, use tampons, or have sex for 2 weeks after the procedure, or until your health care provider approves. °· Do not drive for 24 hours after the procedure, or for as long as told by your health care provider. °· Do not drive, use heavy machinery, or drink alcohol while taking prescription pain medicines. °Medicines ° °· Take over-the-counter and prescription medicines only as told by your health care provider. °· Do not take aspirin during recovery. It can increase the risk of bleeding. °General instructions °· Do not take baths, swim, or use a hot tub until your health care provider approves. Take showers instead of baths for 2 weeks, or for as long as told by your health care provider. °· To prevent or treat constipation while you are taking prescription pain medicine, your health care provider may recommend that you: °? Drink enough fluid to keep your urine clear or pale yellow. °? Take over-the-counter or prescription medicines. °? Eat foods that are high in fiber, such as fresh fruits and vegetables, whole grains, and beans. °? Limit foods that are high in fat and processed sugars, such as fried and sweet foods. °· Keep all follow-up visits as told by your health care provider. This is important. °Contact a health care provider if: °· You feel dizzy or lightheaded. °· You feel nauseous. °· You have abnormal vaginal discharge. °· You have a rash. °· You have pain that does not get better with  medicine. °· You have chills. °Get help right away if: °· You have bleeding that is heavier than a normal menstrual period. °· You have a fever. °· You have pain or cramps that get worse. °· You develop new abdominal pain. °· You faint. °· You have pain in your shoulders. °· You have shortness of breath. °Summary °· After the procedure, you may have cramping and some vaginal bleeding. °· Do not douche, use tampons, or have sex for 2 weeks after the procedure, or until your health care provider approves. °· Do not take baths, swim, or use a hot tub until your health care provider approves. Take showers instead of baths for 2 weeks, or for as long as told by your health care provider. °· Report any unusual symptoms to your health care provider. °· Keep all follow-up visits as told by your health care provider. This is important. °This information is not intended to replace advice given to you by your health care provider. Make sure you discuss any questions you have with your health care provider. °Document Released: 03/30/2013 Document Revised: 05/22/2017 Document Reviewed: 07/08/2016 °Elsevier Patient Education © 2020 [employer] ° ° °Post Anesthesia Home Care Instructions ° °Activity: °Get plenty of rest for the remainder of the day. A responsible individual must stay with you for 24 hours following the procedure.  °For the next 24 hours, DO NOT: °-Drive a car °-Operate machinery °-Drink alcoholic beverages °-Take any medication unless instructed by your physician °-Make any legal decisions or sign important papers. ° °Meals: °Start   Start with liquid foods such as gelatin or soup. Progress to regular foods as tolerated. Avoid greasy, spicy, heavy foods. If nausea and/or vomiting occur, drink only clear liquids until the nausea and/or vomiting subsides. Call your physician if vomiting continues.  Special Instructions/Symptoms: Your throat may feel dry or sore from the anesthesia or the breathing tube placed in your  throat during surgery. If this causes discomfort, gargle with warm salt water. The discomfort should disappear within 24 hours.       

## 2019-05-02 NOTE — Op Note (Signed)
Operative Note  05/02/2019  8:03 AM  PATIENT:  Samantha Schultz  53 y.o. female  PRE-OPERATIVE DIAGNOSIS:  Post menopausal bleeding with submucous fibroid  POST-OPERATIVE DIAGNOSIS:  Post menopausal bleeding with Submucous Fibroid and Endometrial Polyp  PROCEDURE:  Procedure(s): HYSTEROSCOPY WITH MYOSURE EXCISION/DILATATION & CURETTAGE  SURGEON:  Surgeon(s): Princess Bruins, MD  ANESTHESIA:   general with Laryngeal Mask  FINDINGS: Arcuate uterus.  Left horn Submucosal Myoma about 1.5 cm and Right horn Endometrial Polyp.  DESCRIPTION OF OPERATION: Under general anesthesia with laryngeal mask the patient is in lithotomy position.  She is prepped with Betadine on the suprapubic, vulvar and vaginal areas.  The bladder is catheterized.  She is draped as usual.  Timeout is done.  The vaginal exam reveals an anteverted uterus, normal volume and mobile.  No adnexal mass.  The speculum is inserted in the vagina.  The anterior lip of the cervix is grasped with a tenaculum.  A paracervical block is done with lidocaine 1% a total of 20 cc at 4 and 8:00.  Dilation of the cervix with Pratt dilators up to #27 without difficulty.  The hysteroscope is inserted in the intrauterine cavity.  Inspection reveals an arcuate uterus with a submucosal myoma measuring about 1.5 cm at the anterior left horn and a 1 cm endometrial polyp at the anterior right horn.  No other lesion is present.  Both ostia are well seen.  Pictures are taken.  We then inserted the reach MyoSure instrument.  Both lesions described above were completely excised. Pictures were taken after excision.  Hemostasis was adequate.  The hysteroscope with MyoSure were removed.  We used a small sharp curette to curettage all surfaces of the intra uterine cavity systematically.  All specimens were sent together to pathology.  The curette was removed.  The thick inoculum was removed from the cervix.  Hemostasis was adequate.  The speculum was removed.   The patient was brought to recovery room in good and stable status.  ESTIMATED BLOOD LOSS: 10 mL FLUID DEFICIT: 265 mL  Intake/Output Summary (Last 24 hours) at 05/02/2019 0803 Last data filed at 05/02/2019 0754 Gross per 24 hour  Intake -  Output 60 ml  Net -60 ml     BLOOD ADMINISTERED:none   LOCAL MEDICATIONS USED:  LIDOCAINE 1% Paracervical Block  SPECIMEN:  Source of Specimen:  Excision of SM Myoma/Endometrial Polyp and Endometrial Curettings  DISPOSITION OF SPECIMEN:  PATHOLOGY  COUNTS:  YES  PLAN OF CARE: Transfer to PACU  Marie-Lyne LavoieMD8:03 AM

## 2019-05-02 NOTE — Anesthesia Postprocedure Evaluation (Signed)
Anesthesia Post Note  Patient: Samantha Schultz  Procedure(s) Performed: DILATATION & CURETTAGE/HYSTEROSCOPY WITH MYOSURE (N/A Vagina )     Patient location during evaluation: PACU Anesthesia Type: General Level of consciousness: awake and alert and oriented Pain management: pain level controlled Vital Signs Assessment: post-procedure vital signs reviewed and stable Respiratory status: spontaneous breathing, nonlabored ventilation and respiratory function stable Cardiovascular status: blood pressure returned to baseline Postop Assessment: no apparent nausea or vomiting Anesthetic complications: no    Last Vitals:  Vitals:   05/02/19 0830 05/02/19 0845  BP: 119/67 119/86  Pulse: 74 70  Resp: 15 13  Temp:    SpO2: 100% 100%    Last Pain:  Vitals:   05/02/19 0845  TempSrc:   PainSc: 0-No pain                 Brennan Bailey

## 2019-05-02 NOTE — Anesthesia Procedure Notes (Signed)
Procedure Name: LMA Insertion Date/Time: 05/02/2019 7:24 AM Performed by: Bonney Aid, CRNA Pre-anesthesia Checklist: Patient identified, Emergency Drugs available, Suction available and Patient being monitored Patient Re-evaluated:Patient Re-evaluated prior to induction Oxygen Delivery Method: Circle system utilized Preoxygenation: Pre-oxygenation with 100% oxygen Induction Type: IV induction Ventilation: Mask ventilation without difficulty LMA: LMA inserted LMA Size: 4.0 Number of attempts: 1 Airway Equipment and Method: Bite block Placement Confirmation: positive ETCO2 Tube secured with: Tape Dental Injury: Teeth and Oropharynx as per pre-operative assessment

## 2019-05-02 NOTE — Transfer of Care (Signed)
Immediate Anesthesia Transfer of Care Note  Patient: Samantha Schultz  Procedure(s) Performed: DILATATION & CURETTAGE/HYSTEROSCOPY WITH MYOSURE (N/A Vagina )  Patient Location: PACU  Anesthesia Type:General  Level of Consciousness: drowsy  Airway & Oxygen Therapy: Patient Spontanous Breathing and Patient connected to nasal cannula oxygen  Post-op Assessment: Report given to RN  Post vital signs: Reviewed and stable  Last Vitals:  Vitals Value Taken Time  BP 125/83 05/02/19 0810  Temp    Pulse 77 05/02/19 0811  Resp 19 05/02/19 0811  SpO2 100 % 05/02/19 0811  Vitals shown include unvalidated device data.  Last Pain:  Vitals:   05/02/19 0555  TempSrc: Oral         Complications: No apparent anesthesia complications

## 2019-05-03 ENCOUNTER — Encounter (HOSPITAL_BASED_OUTPATIENT_CLINIC_OR_DEPARTMENT_OTHER): Payer: Self-pay | Admitting: Obstetrics & Gynecology

## 2019-05-03 LAB — SURGICAL PATHOLOGY

## 2019-05-06 ENCOUNTER — Other Ambulatory Visit: Payer: Self-pay | Admitting: Obstetrics & Gynecology

## 2019-05-27 ENCOUNTER — Encounter: Payer: Self-pay | Admitting: Obstetrics & Gynecology

## 2019-05-27 ENCOUNTER — Ambulatory Visit: Payer: Commercial Managed Care - PPO | Admitting: Obstetrics & Gynecology

## 2019-05-27 ENCOUNTER — Ambulatory Visit (INDEPENDENT_AMBULATORY_CARE_PROVIDER_SITE_OTHER): Payer: Commercial Managed Care - PPO | Admitting: Obstetrics & Gynecology

## 2019-05-27 ENCOUNTER — Other Ambulatory Visit: Payer: Self-pay

## 2019-05-27 VITALS — BP 120/76

## 2019-05-27 DIAGNOSIS — Z09 Encounter for follow-up examination after completed treatment for conditions other than malignant neoplasm: Secondary | ICD-10-CM

## 2019-05-27 NOTE — Progress Notes (Signed)
    Samantha Schultz 11-Nov-1965 [id number]        53 y.o.  G0P0000   RP: Postop HSC/Excision/D+C on May 02, 2019  HPI: Very good postop progression since surgery May 02, 2019.  No vaginal bleeding and no vaginal discharge.  No pelvic pain.  No fever.  Urine and bowel movements normal.   OB History  Gravida Para Term Preterm AB Living  0 0 0 0 0 0  SAB TAB Ectopic Multiple Live Births  0 0 0 0 0    Past medical history,surgical history, problem list, medications, allergies, family history and social history were all reviewed and documented in the EPIC chart.   Directed ROS with pertinent positives and negatives documented in the history of present illness/assessment and plan.  Exam:  Vitals:   05/27/19 1611  BP: 120/76   General appearance:  Normal  Abdomen: Normal  Gynecologic exam: Vulva normal.  Bimanual exam:  Uterus AV, normal volume, mobile, NT.  No adnexal mass.  Normal secretions, no bleeding.  Patho 05/02/2019: ENDOMETRIUM, RESECTION WITH CURRETTINGS:  - Benign endometrioid polyp  - Benign inactive endometrium  - Benign cervical squamous mucosa  - Scant benign smooth muscle  - Negative for hyperplasia or malignancy    Assessment/Plan:  53 y.o. G0  1. Status post gynecological surgery, follow-up exam Good postop evolution with normal gynecologic exam today pathology benign reviewed with patient.  Patient reassured.  Follow-up when due for annual gynecologic exam.  Princess Bruins MD, 4:44 PM 05/27/2019

## 2019-05-29 ENCOUNTER — Encounter: Payer: Self-pay | Admitting: Obstetrics & Gynecology

## 2019-05-29 NOTE — Patient Instructions (Signed)
1. Status post gynecological surgery, follow-up exam Good postop evolution with normal gynecologic exam today pathology benign reviewed with patient.  Patient reassured.  Samantha Schultz, it was a pleasure seeing you today!

## 2020-01-05 ENCOUNTER — Ambulatory Visit: Payer: Commercial Managed Care - PPO | Admitting: Obstetrics & Gynecology

## 2020-01-05 ENCOUNTER — Telehealth: Payer: Self-pay

## 2020-01-05 NOTE — Telephone Encounter (Signed)
Patient called with burning sensation around her umbilicus x 2 mos. She describes it as to the right of her umbilicus. She called today because it has worsened over the last week.  She feels a slight bulge. 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 She does not have urinary sx.  She kept stating that she talked with her PCP in [place] through a video visit and he said she needed to "have a scan" or a referral. I explained that this is not something we can handle over the phone and that an exam will be needed to determine what direction she should proceed.  I scheduled her for 9:15am tomorrow morning.

## 2020-01-06 ENCOUNTER — Ambulatory Visit: Payer: Self-pay | Admitting: Obstetrics & Gynecology

## 2020-01-10 ENCOUNTER — Ambulatory Visit (INDEPENDENT_AMBULATORY_CARE_PROVIDER_SITE_OTHER): Payer: Commercial Managed Care - PPO | Admitting: Surgery

## 2020-01-10 ENCOUNTER — Encounter: Payer: Self-pay | Admitting: Surgery

## 2020-01-10 ENCOUNTER — Other Ambulatory Visit
Admission: RE | Admit: 2020-01-10 | Discharge: 2020-01-10 | Disposition: A | Discharge status: Home / Self Care | Payer: Commercial Managed Care - PPO | Attending: Surgery | Admitting: Surgery

## 2020-01-10 ENCOUNTER — Other Ambulatory Visit: Payer: Self-pay

## 2020-01-10 VITALS — BP 124/80 | HR 71 | Temp 98.0°F | Ht 59.25 in | Wt 144.0 lb

## 2020-01-10 DIAGNOSIS — R1084 Generalized abdominal pain: Secondary | ICD-10-CM | POA: Diagnosis not present

## 2020-01-10 DIAGNOSIS — Z1211 Encounter for screening for malignant neoplasm of colon: Secondary | ICD-10-CM

## 2020-01-10 LAB — COMPREHENSIVE METABOLIC PANEL
ALT: 19 U/L (ref 0–44)
AST: 19 U/L (ref 15–41)
Albumin: 4 g/dL (ref 3.5–5.0)
Alkaline Phosphatase: 73 U/L (ref 38–126)
Anion gap: 12 (ref 5–15)
BUN: 9 mg/dL (ref 6–20)
CO2: 28 mmol/L (ref 22–32)
Calcium: 9.1 mg/dL (ref 8.9–10.3)
Chloride: 97 mmol/L — ABNORMAL LOW (ref 98–111)
Creatinine, Ser: 0.81 mg/dL (ref 0.44–1.00)
GFR calc Af Amer: >60 mL/min (ref >60)
GFR calc non Af Amer: >60 mL/min (ref >60)
Glucose, Bld: 95 mg/dL (ref 70–99)
Potassium: 4 mmol/L (ref 3.5–5.1)
Sodium: 137 mmol/L (ref 135–145)
Total Bilirubin: 0.6 mg/dL (ref 0.3–1.2)
Total Protein: 7.4 g/dL (ref 6.5–8.1)

## 2020-01-10 LAB — CBC WITH DIFFERENTIAL/PLATELET
Abs Immature Granulocytes: 0.02 10*3/uL (ref 0.00–0.07)
Basophils Absolute: 0 10*3/uL (ref 0.0–0.1)
Basophils Relative: 1 %
Eosinophils Absolute: 0 10*3/uL (ref 0.0–0.5)
Eosinophils Relative: 0 %
HCT: 37.2 % (ref 36.0–46.0)
Hemoglobin: 12.3 g/dL (ref 12.0–15.0)
Immature Granulocytes: 0 %
Lymphocytes Relative: 32 %
Lymphs Abs: 2.8 10*3/uL (ref 0.7–4.0)
MCH: 30.4 pg (ref 26.0–34.0)
MCHC: 33.1 g/dL (ref 30.0–36.0)
MCV: 92.1 fL (ref 80.0–100.0)
Monocytes Absolute: 0.6 10*3/uL (ref 0.1–1.0)
Monocytes Relative: 6 %
Neutro Abs: 5.4 10*3/uL (ref 1.7–7.7)
Neutrophils Relative %: 61 %
Platelets: 341 10*3/uL (ref 150–400)
RBC: 4.04 MIL/uL (ref 3.87–5.11)
RDW: 12.3 % (ref 11.5–15.5)
WBC: 8.9 10*3/uL (ref 4.0–10.5)
nRBC: 0 % (ref 0.0–0.2)

## 2020-01-10 NOTE — Addendum Note (Signed)
Addended by: Lesly Rubenstein on: 01/10/2020 12:10 PM   Modules accepted: Orders

## 2020-01-10 NOTE — Progress Notes (Signed)
Patient ID: Samantha Schultz, female   DOB: 1965-11-13, 54 y.o.   MRN: [id number]  Chief Complaint: Abdominal burning sensation x2 months  History of Present Illness Samantha Schultz is a 54 y.o. female with a fairly well localized burning sensation, perceived to be on the inside, about 3 inches inferior to the navel.  She reports that it comes and goes, most recently exacerbated by activity i.e. climbing into the truck.  She has had no bulge or mass in the over that area.  The burning lasts for minutes to hours.  It started about 2 months ago without any known causative factor/association with increased activity etc.  She reports irregular bowel habits with having moved loose stools yesterday about 4 times before lunch, but only today had 1 bowel movement so far.  She is done a Cologuard but has not had a screening colonoscopy yet.  She seems to think that her stools are sticky, but not black or dark in color and with no blood.  She reports associated nausea and heartburn.  She has had no abdominal surgeries, having a gynecological procedure that did not involve any laparoscopy or laparotomy.  She denies any groin pain or bulge.  Past Medical History Past Medical History:  Diagnosis Date  . ADHD (attention deficit hyperactivity disorder)   . Frequency of micturition   . History of anemia   . Hypothyroidism    followed by pcp  . PMB (postmenopausal bleeding)   . Submucous myoma of uterus   . Wears glasses       Past Surgical History:  Procedure Laterality Date  . BREAST BIOPSY Left 02/2015   benign  . DILATATION & CURETTAGE/HYSTEROSCOPY WITH MYOSURE N/A 05/02/2019   Procedure: [place];  Surgeon: Princess Bruins, MD;  Location: [facility] [place];  Service: Gynecology;  Laterality: N/A;    Allergies  Allergen Reactions  . Keflex [Cephalexin]     Pt does not remember reaction  . Codeine     Pt does not remember reaction  . Latex  Itching    Current Outpatient Medications  Medication Sig Dispense Refill  . amphetamine-dextroamphetamine (ADDERALL) 30 MG tablet Take 30 mg by mouth 2 (two) times daily. Morning and afternoon    . Artificial Tear Ointment (DRY EYES OP) Apply to eye as needed.    Marland Kitchen BIOTIN PO Take by mouth daily.    . busPIRone (BUSPAR) 15 MG tablet Take 15 mg by mouth 2 (two) times daily.    . Cholecalciferol (VITAMIN D3 PO) Take by mouth daily.    . COLLAGEN PO Take by mouth daily.    Marland Kitchen escitalopram (LEXAPRO) 20 MG tablet Take 20 mg by mouth at bedtime.     . fluticasone (FLONASE) 50 MCG/ACT nasal spray Place 1 spray into both nostrils daily.    Marland Kitchen levothyroxine (SYNTHROID) 25 MCG tablet Take 25 mcg by mouth daily before breakfast.    . traMADol (ULTRAM-ER) 100 MG 24 hr tablet Take 1 tablet (100 mg total) by mouth daily. 7 tablet 0  . VITAMIN E PO Take by mouth daily.     No current facility-administered medications for this visit.    Family History Family History  Problem Relation Age of Onset  . Breast cancer Mother        breast   . Diabetes Mother       Social History Social History   Tobacco Use  . Smoking status: Former Smoker    Years:  3.00    Types: Cigarettes    Quit date: 01/11/2018    Years since quitting: 1.9  . Smokeless tobacco: Never Used  Vaping Use  . Vaping Use: Every day  . Start date: 04/28/2018  . Devices: Smoke FMLK  Substance Use Topics  . Alcohol use: Not Currently  . Drug use: Never        Review of Systems  Constitutional: Positive for fever (Low-grade) and malaise/fatigue.  HENT: Negative.   Eyes: Negative.   Respiratory: Negative.   Cardiovascular: Negative.   Gastrointestinal: Positive for abdominal pain, constipation, diarrhea, heartburn and nausea.  Genitourinary: Positive for frequency.  Skin: Negative.   Neurological: Negative.   Psychiatric/Behavioral: Negative.      Physical Exam Blood pressure 124/80, pulse 71, temperature 98 F (36.7  C), height 4' 11.25" (1.505 m), weight 144 lb (65.3 kg), last menstrual period 06/23/2018, SpO2 98 %. Last Weight  Most recent update: 01/10/2020 11:29 AM   Weight  65.3 kg (144 lb)            CONSTITUTIONAL: Well developed, and nourished, appropriately responsive and aware without distress.   EYES: Sclera non-icteric.   EARS, NOSE, MOUTH AND THROAT: Mask worn.  Hearing is intact to voice.  NECK: Trachea is midline, and there is no jugular venous distension.  LYMPH NODES:  Lymph nodes in the neck are not enlarged. RESPIRATORY:  Lungs are clear, and breath sounds are equal bilaterally. Normal respiratory effort without pathologic use of accessory muscles. CARDIOVASCULAR: Heart is regular in rate and rhythm. GI: The abdomen is soft, nontender, and nondistended. There were no palpable masses. I did not appreciate hepatosplenomegaly. There were normal bowel sounds. MUSCULOSKELETAL:  Symmetrical muscle tone appreciated in all four extremities.    SKIN: Skin turgor is normal. No pathologic skin lesions appreciated.  NEUROLOGIC:  Motor and sensation appear grossly normal.  Cranial nerves are grossly without defect. PSYCH:  Alert and oriented to person, place and time. Affect is flat, but otherwise appropriate for situation.  Data Reviewed I have personally reviewed what is currently available of the patient's imaging, recent labs and medical records.      Imaging: Within last 24 hrs: No results found.  Assessment    Vague lower abdominal pain, with associated irregular bowel habits. There are no problems to display for this patient.   Plan    We discussed screening colonoscopy, she would like to pursue imaging with CT.  We will get some basic lab work.  Urinalysis would probably be a good idea as well.  Face-to-face time spent with the patient and accompanying care providers(if present) was 30 minutes, with more than 50% of the time spent counseling, educating, and coordinating care  of the patient.      Ronny Bacon M.D., FACS 01/10/2020, 11:56 AM

## 2020-01-10 NOTE — Patient Instructions (Addendum)
We will get you set up for a CT scan of your abdomen.   We will get you referred to Gastroenterology to look at getting a Colonoscopy. They will call you to schedule an appointment.   Please have your lab work done at [facility], [facility] entrance, today or tomorrow.  You have been scheduled for a CT abdomen & pelvis with contrast at [facility]ross from the [facility]) on Monday July 26th at 11:30 am. Please arrive there by 11:15 am.  Prep: Nothing to eat or drink for 4 hours prior and you will need to pick up a prep kit.  The [facility] is located at [address], [place], [place], [place] [address].  We will call you with your results.

## 2020-01-13 ENCOUNTER — Encounter: Payer: Commercial Managed Care - PPO | Admitting: Obstetrics & Gynecology

## 2020-01-16 ENCOUNTER — Other Ambulatory Visit: Payer: Self-pay

## 2020-01-16 ENCOUNTER — Telehealth (INDEPENDENT_AMBULATORY_CARE_PROVIDER_SITE_OTHER): Payer: Self-pay | Admitting: Gastroenterology

## 2020-01-16 ENCOUNTER — Ambulatory Visit
Admission: RE | Admit: 2020-01-16 | Discharge: 2020-01-16 | Disposition: A | Discharge status: Home / Self Care | Payer: Commercial Managed Care - PPO | Source: Ambulatory Visit | Attending: Surgery | Admitting: Surgery

## 2020-01-16 DIAGNOSIS — Z1211 Encounter for screening for malignant neoplasm of colon: Secondary | ICD-10-CM

## 2020-01-16 DIAGNOSIS — R1084 Generalized abdominal pain: Secondary | ICD-10-CM | POA: Diagnosis present

## 2020-01-16 MED ORDER — IOHEXOL 300 MG/ML  SOLN
100.0000 mL | Freq: Once | INTRAMUSCULAR | Status: AC | PRN
  Administered 2020-01-16: 100 mL via INTRAVENOUS

## 2020-01-16 NOTE — Progress Notes (Signed)
Gastroenterology Pre-Procedure Review  Request Date: Thursday 02/02/20 Requesting Physician: Dr. Bonna Gains  PATIENT REVIEW QUESTIONS: The patient responded to the following health history questions as indicated:    1. Are you having any GI issues? yes (Irregular bowel habits.  Offered to schedule an appt-pt declined.) 2. Do you have a personal history of Polyps? no 3. Do you have a family history of Colon Cancer or Polyps? yes (sister and father colon polyps) 4. Diabetes Mellitus? no 5. Joint replacements in the past 12 months?No joint replacements.  Fibroid Surgery November 2020 6. Major health problems in the past 3 months?no 7. Any artificial heart valves, MVP, or defibrillator?no    MEDICATIONS & ALLERGIES:    Patient reports the following regarding taking any anticoagulation/antiplatelet therapy:   Plavix, Coumadin, Eliquis, Xarelto, Lovenox, Pradaxa, Brilinta, or Effient? no Aspirin? no  Patient confirms/reports the following medications:  Current Outpatient Medications  Medication Sig Dispense Refill   amphetamine-dextroamphetamine (ADDERALL) 30 MG tablet Take 30 mg by mouth 2 (two) times daily. Morning and afternoon     Artificial Tear Ointment (DRY EYES OP) Apply to eye as needed.     BIOTIN PO Take by mouth daily.     busPIRone (BUSPAR) 15 MG tablet Take 15 mg by mouth 2 (two) times daily.     Cholecalciferol (VITAMIN D3 PO) Take by mouth daily.     COLLAGEN PO Take by mouth daily.     escitalopram (LEXAPRO) 20 MG tablet Take 20 mg by mouth at bedtime.      fluticasone (FLONASE) 50 MCG/ACT nasal spray Place 1 spray into both nostrils daily.     levothyroxine (SYNTHROID) 25 MCG tablet Take 25 mcg by mouth daily before breakfast.     traMADol (ULTRAM-ER) 100 MG 24 hr tablet Take 1 tablet (100 mg total) by mouth daily. 7 tablet 0   VITAMIN E PO Take by mouth daily. (Patient not taking: Reported on 01/16/2020)     No current facility-administered medications for  this visit.    Patient confirms/reports the following allergies:  Allergies  Allergen Reactions   Keflex [Cephalexin]     Pt does not remember reaction   Codeine     Pt does not remember reaction   Latex Itching    No orders of the defined types were placed in this encounter.   AUTHORIZATION INFORMATION Primary Insurance: 1D#: Group #:  Secondary Insurance: 1D#: Group #:  SCHEDULE INFORMATION: Date: Thursday 02/02/20 Time: Location:[facility]

## 2020-01-19 ENCOUNTER — Encounter: Payer: Commercial Managed Care - PPO | Admitting: Obstetrics & Gynecology

## 2020-01-31 ENCOUNTER — Other Ambulatory Visit: Admission: RE | Admit: 2020-01-31 | Payer: Commercial Managed Care - PPO | Source: Ambulatory Visit

## 2020-01-31 ENCOUNTER — Telehealth: Payer: Self-pay

## 2020-01-31 IMAGING — MG DIGITAL DIAGNOSTIC BILATERAL MAMMOGRAM WITH TOMO AND CAD
8 of 14 series · 8 of 40 positions shown · non-contrast
Comparison: Previous exam(s).

CLINICAL DATA: 52-year-old female recalled from screening mammogram
dated 06/11/2018 for possible bilateral masses.

EXAM:
DIGITAL DIAGNOSTIC BILATERAL MAMMOGRAM WITH CAD AND TOMO
ULTRASOUND BILATERAL BREAST

[L ML synth-2D]
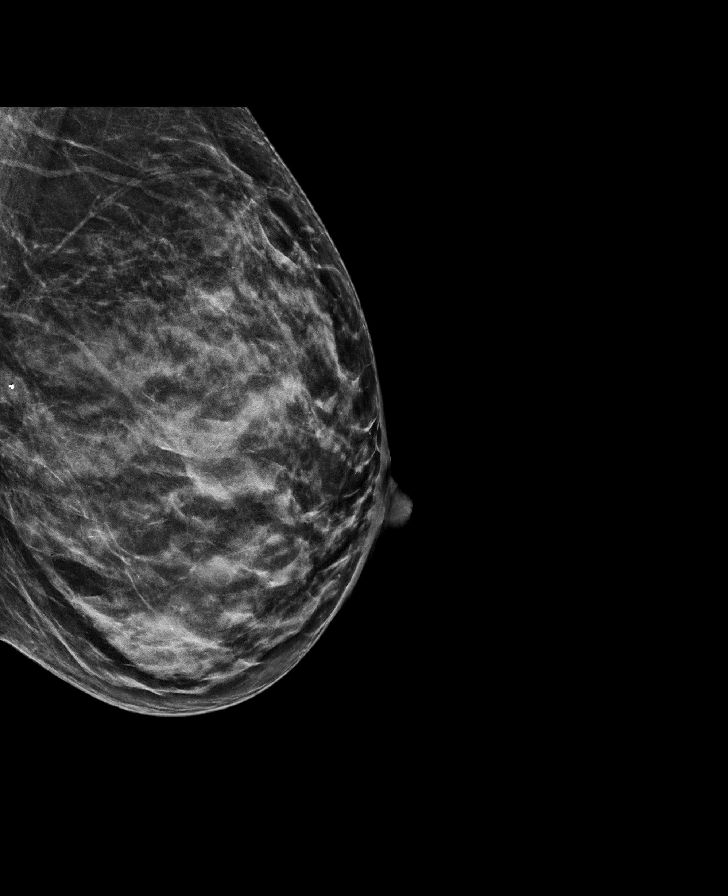

[L MLO synth-2D]
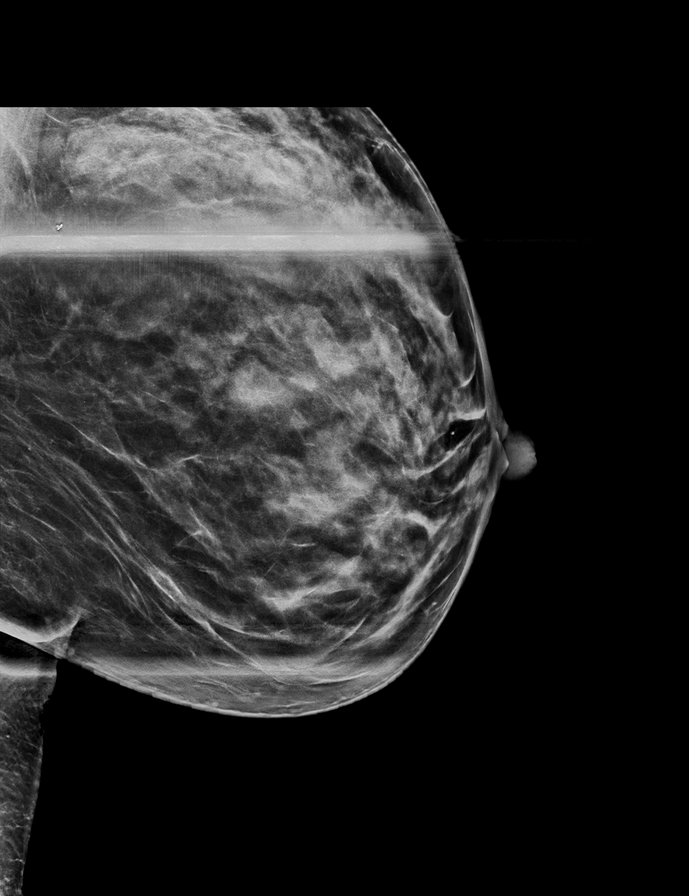

[L CC synth-2D (1 of 2)]
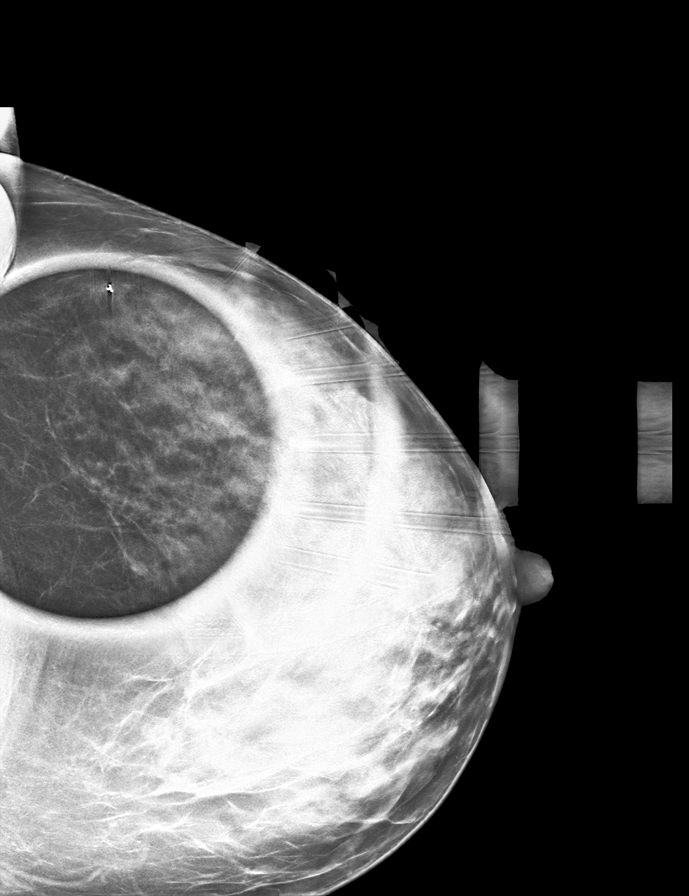

[L CC synth-2D (2 of 2)]
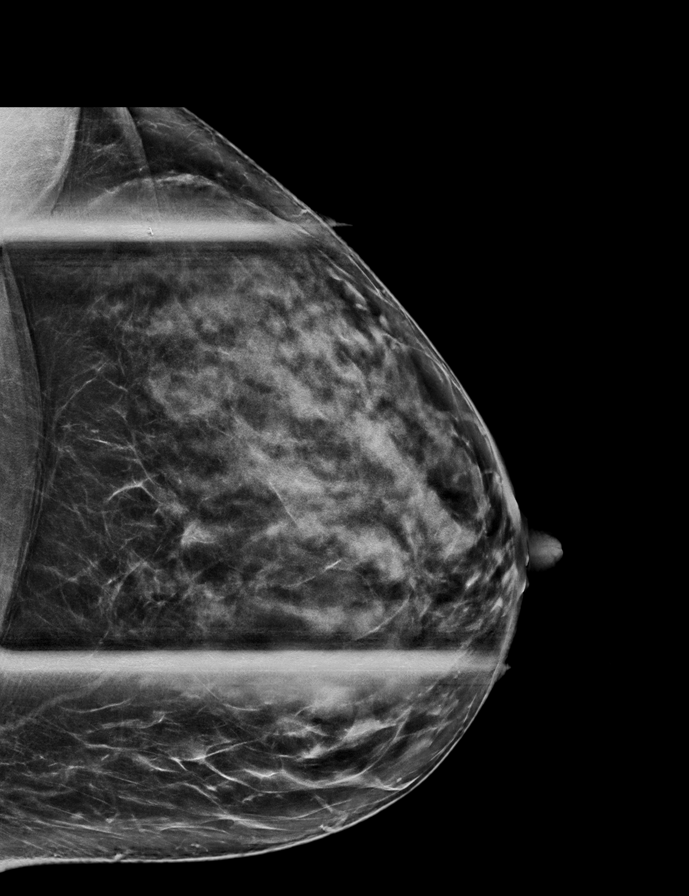

[R CC synth-2D]
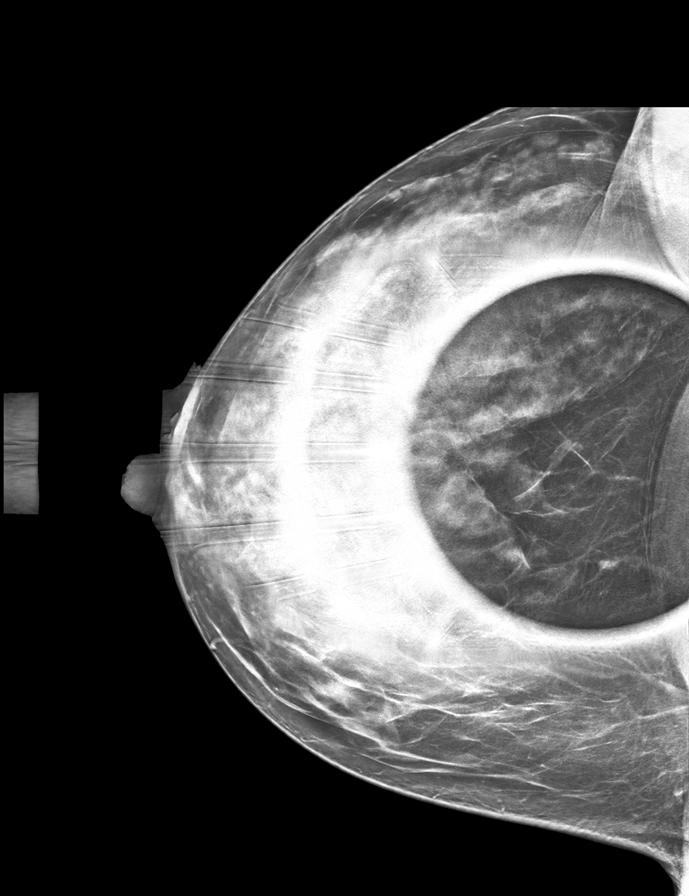

[R MLO synth-2D]
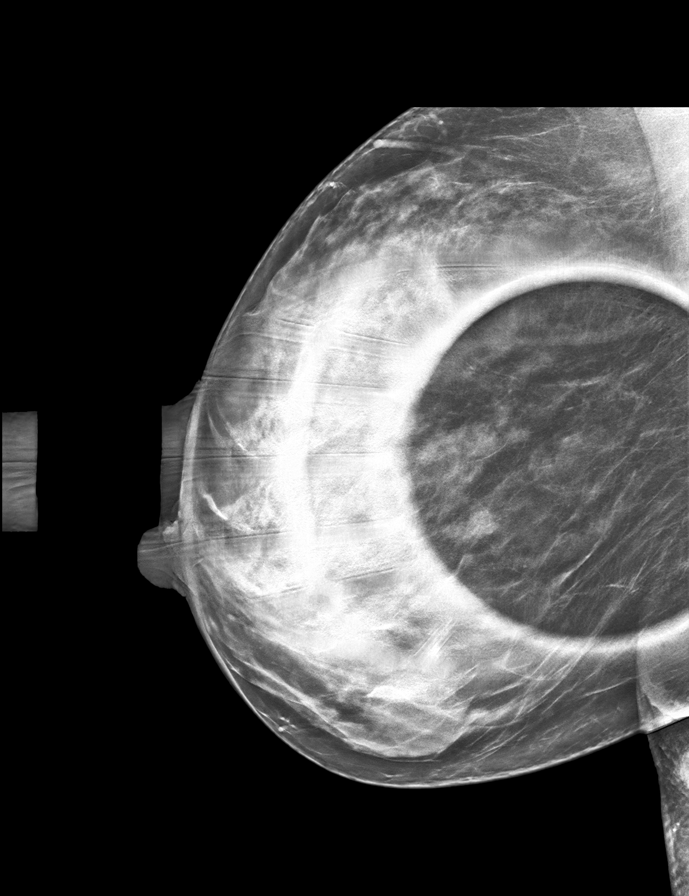

[R ML synth-2D]
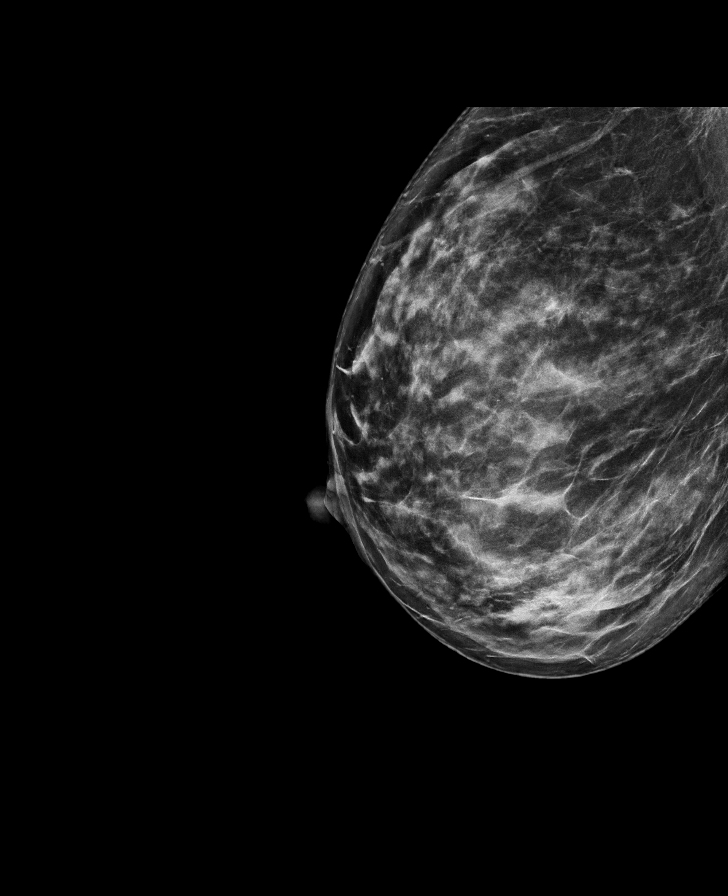

[L ML tomo · tomo slice 30/59.0]
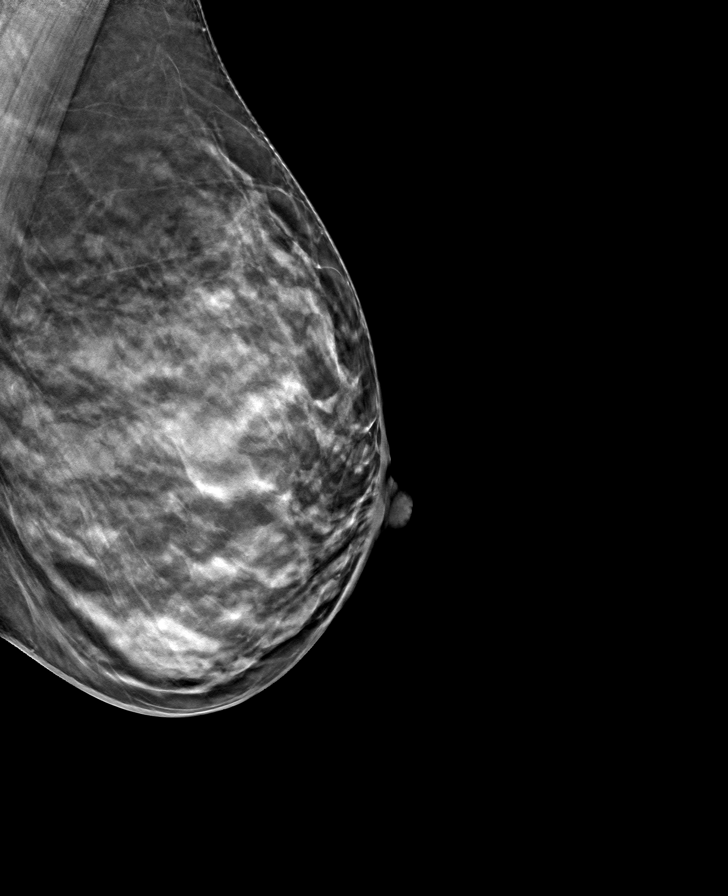

[8 of 40 positions shown; findings below may reference images not displayed]

ACR Breast Density Category c: The breast tissue is heterogeneously
dense, which may obscure small masses.
FINDINGS: Previously described, possible masses in the deep subareolar right
breast and central left breast do not definitively persist on
today's additional tomosynthesis views. Precautionary ultrasound was
performed.

Mammographic images were processed with CAD.

Targeted ultrasound is performed, showing normal fibroglandular
tissue without focal or suspicious sonographic abnormality.
Evaluation of the entire central right breast and medial and lateral
left breast was performed. An incidental simple cyst is noted at the
8 o'clock position 3 cm from the nipple left.
IMPRESSION: No persistent, suspicious mammographic or sonographic findings
bilaterally.

RECOMMENDATION:
Screening mammogram in one year.(Code:FL-Q-DCP)

I have discussed the findings and recommendations with the patient.
Results were also provided in writing at the conclusion of the
visit. If applicable, a reminder letter will be sent to the patient
regarding the next appointment.

BI-RADS CATEGORY  2: Benign.

## 2020-01-31 NOTE — Telephone Encounter (Signed)
Returned patients call.  She LVM to reschedule her 02/02/20 colonoscopy with Dr. Bonna Gains at [facility].  Asked her to call me back to reschedule.  Thanks,  [place], [place]

## 2020-02-02 ENCOUNTER — Ambulatory Visit
Admission: RE | Admit: 2020-02-02 | Payer: Commercial Managed Care - PPO | Source: Home / Self Care | Admitting: Gastroenterology

## 2020-02-02 ENCOUNTER — Encounter: Admission: RE | Payer: Self-pay | Source: Home / Self Care

## 2020-02-02 SURGERY — COLONOSCOPY WITH PROPOFOL
Anesthesia: General

## 2020-02-20 ENCOUNTER — Other Ambulatory Visit: Payer: Commercial Managed Care - PPO

## 2020-08-31 ENCOUNTER — Other Ambulatory Visit: Payer: Self-pay | Admitting: Internal Medicine

## 2020-08-31 DIAGNOSIS — H539 Unspecified visual disturbance: Secondary | ICD-10-CM

## 2020-08-31 DIAGNOSIS — R4701 Aphasia: Secondary | ICD-10-CM

## 2020-09-03 ENCOUNTER — Other Ambulatory Visit: Payer: Self-pay | Admitting: Internal Medicine

## 2020-09-03 DIAGNOSIS — Z1231 Encounter for screening mammogram for malignant neoplasm of breast: Secondary | ICD-10-CM

## 2020-09-10 ENCOUNTER — Ambulatory Visit
Admission: RE | Admit: 2020-09-10 | Discharge: 2020-09-10 | Disposition: A | Discharge status: Home / Self Care | Payer: Commercial Managed Care - PPO | Source: Ambulatory Visit | Attending: Internal Medicine | Admitting: Internal Medicine

## 2020-09-10 ENCOUNTER — Other Ambulatory Visit: Payer: Self-pay

## 2020-09-10 DIAGNOSIS — R4701 Aphasia: Secondary | ICD-10-CM | POA: Diagnosis present

## 2020-09-10 DIAGNOSIS — H539 Unspecified visual disturbance: Secondary | ICD-10-CM | POA: Insufficient documentation

## 2021-07-25 ENCOUNTER — Other Ambulatory Visit: Payer: Self-pay | Admitting: Internal Medicine

## 2021-07-25 DIAGNOSIS — Z1231 Encounter for screening mammogram for malignant neoplasm of breast: Secondary | ICD-10-CM

## 2021-08-06 ENCOUNTER — Other Ambulatory Visit: Payer: Self-pay

## 2021-08-06 ENCOUNTER — Ambulatory Visit
Admission: RE | Admit: 2021-08-06 | Discharge: 2021-08-06 | Disposition: A | Discharge status: Home / Self Care | Payer: 59 | Source: Ambulatory Visit | Attending: Internal Medicine | Admitting: Internal Medicine

## 2021-08-06 DIAGNOSIS — Z1231 Encounter for screening mammogram for malignant neoplasm of breast: Secondary | ICD-10-CM | POA: Insufficient documentation

## 2022-03-31 ENCOUNTER — Other Ambulatory Visit (HOSPITAL_COMMUNITY): Payer: Self-pay | Admitting: Internal Medicine

## 2022-03-31 ENCOUNTER — Other Ambulatory Visit: Payer: Self-pay | Admitting: Internal Medicine

## 2022-03-31 DIAGNOSIS — M7989 Other specified soft tissue disorders: Secondary | ICD-10-CM
